# Patient Record
Sex: Male | Born: 1965 | Race: White | Hispanic: No | Marital: Married | Smoking: Never smoker
Health system: Southern US, Community
[De-identification: ages and names within clinical notes are randomized; demographics above are authoritative.]

## PROBLEM LIST (undated history)

## (undated) DIAGNOSIS — M549 Dorsalgia, unspecified: Secondary | ICD-10-CM

## (undated) DIAGNOSIS — M2550 Pain in unspecified joint: Secondary | ICD-10-CM

## (undated) DIAGNOSIS — E663 Overweight: Secondary | ICD-10-CM

## (undated) DIAGNOSIS — E786 Lipoprotein deficiency: Secondary | ICD-10-CM

## (undated) DIAGNOSIS — R0602 Shortness of breath: Secondary | ICD-10-CM

## (undated) DIAGNOSIS — R002 Palpitations: Secondary | ICD-10-CM

## (undated) DIAGNOSIS — R42 Dizziness and giddiness: Secondary | ICD-10-CM

## (undated) DIAGNOSIS — R000 Tachycardia, unspecified: Secondary | ICD-10-CM

## (undated) DIAGNOSIS — M7989 Other specified soft tissue disorders: Secondary | ICD-10-CM

## (undated) DIAGNOSIS — R911 Solitary pulmonary nodule: Secondary | ICD-10-CM

## (undated) DIAGNOSIS — N200 Calculus of kidney: Secondary | ICD-10-CM

## (undated) DIAGNOSIS — R7309 Other abnormal glucose: Secondary | ICD-10-CM

## (undated) DIAGNOSIS — R079 Chest pain, unspecified: Secondary | ICD-10-CM

## (undated) DIAGNOSIS — I10 Essential (primary) hypertension: Secondary | ICD-10-CM

## (undated) DIAGNOSIS — K219 Gastro-esophageal reflux disease without esophagitis: Secondary | ICD-10-CM

## (undated) DIAGNOSIS — I4891 Unspecified atrial fibrillation: Secondary | ICD-10-CM

## (undated) HISTORY — DX: Lipoprotein deficiency: E78.6

## (undated) HISTORY — DX: Essential (primary) hypertension: I10

## (undated) HISTORY — DX: Shortness of breath: R06.02

## (undated) HISTORY — DX: Palpitations: R00.2

## (undated) HISTORY — DX: Calculus of kidney: N20.0

## (undated) HISTORY — DX: Unspecified atrial fibrillation: I48.91

## (undated) HISTORY — DX: Tachycardia, unspecified: R00.0

## (undated) HISTORY — DX: Dizziness and giddiness: R42

## (undated) HISTORY — DX: Other specified soft tissue disorders: M79.89

## (undated) HISTORY — DX: Solitary pulmonary nodule: R91.1

## (undated) HISTORY — DX: Overweight: E66.3

## (undated) HISTORY — DX: Gastro-esophageal reflux disease without esophagitis: K21.9

## (undated) HISTORY — DX: Dorsalgia, unspecified: M54.9

## (undated) HISTORY — DX: Other abnormal glucose: R73.09

## (undated) HISTORY — DX: Chest pain, unspecified: R07.9

## (undated) HISTORY — DX: Pain in unspecified joint: M25.50

---

## 1994-03-12 HISTORY — PX: KNEE SURGERY: SHX244

## 1999-10-15 ENCOUNTER — Ambulatory Visit (HOSPITAL_COMMUNITY): Admission: RE | Admit: 1999-10-15 | Discharge: 1999-10-15 | Payer: Self-pay

## 2009-04-11 DIAGNOSIS — M1990 Unspecified osteoarthritis, unspecified site: Secondary | ICD-10-CM | POA: Insufficient documentation

## 2009-04-11 DIAGNOSIS — Z0000 Encounter for general adult medical examination without abnormal findings: Secondary | ICD-10-CM | POA: Insufficient documentation

## 2011-12-08 ENCOUNTER — Encounter (HOSPITAL_COMMUNITY): Payer: Self-pay | Admitting: Emergency Medicine

## 2011-12-08 ENCOUNTER — Emergency Department (HOSPITAL_COMMUNITY)
Admission: EM | Admit: 2011-12-08 | Discharge: 2011-12-08 | Disposition: A | Discharge status: Home / Self Care | Payer: BC Managed Care – PPO | Attending: Emergency Medicine | Admitting: Emergency Medicine

## 2011-12-08 ENCOUNTER — Other Ambulatory Visit: Payer: Self-pay

## 2011-12-08 ENCOUNTER — Emergency Department (HOSPITAL_COMMUNITY): Payer: BC Managed Care – PPO

## 2011-12-08 DIAGNOSIS — N200 Calculus of kidney: Secondary | ICD-10-CM | POA: Insufficient documentation

## 2011-12-08 DIAGNOSIS — R911 Solitary pulmonary nodule: Secondary | ICD-10-CM | POA: Insufficient documentation

## 2011-12-08 DIAGNOSIS — R109 Unspecified abdominal pain: Secondary | ICD-10-CM

## 2011-12-08 LAB — CBC WITH DIFFERENTIAL/PLATELET
Basophils Absolute: 0 10*3/uL (ref 0.0–0.1)
Basophils Relative: 0 % (ref 0–1)
Eosinophils Absolute: 0 10*3/uL (ref 0.0–0.7)
Eosinophils Relative: 0 % (ref 0–5)
HCT: 44.1 % (ref 39.0–52.0)
Hemoglobin: 15.9 g/dL (ref 13.0–17.0)
Lymphocytes Relative: 15 % (ref 12–46)
Lymphs Abs: 1.8 10*3/uL (ref 0.7–4.0)
MCH: 31.7 pg (ref 26.0–34.0)
MCHC: 36.1 g/dL — ABNORMAL HIGH (ref 30.0–36.0)
MCV: 88 fL (ref 78.0–100.0)
Monocytes Absolute: 0.6 10*3/uL (ref 0.1–1.0)
Monocytes Relative: 5 % (ref 3–12)
Neutro Abs: 9.2 10*3/uL — ABNORMAL HIGH (ref 1.7–7.7)
Neutrophils Relative %: 79 % — ABNORMAL HIGH (ref 43–77)
Platelets: 230 10*3/uL (ref 150–400)
RBC: 5.01 MIL/uL (ref 4.22–5.81)
RDW: 12.6 % (ref 11.5–15.5)
WBC: 11.6 10*3/uL — ABNORMAL HIGH (ref 4.0–10.5)

## 2011-12-08 LAB — URINALYSIS, ROUTINE W REFLEX MICROSCOPIC
Bilirubin Urine: NEGATIVE
Glucose, UA: NEGATIVE mg/dL
Ketones, ur: 15 mg/dL — AB
Leukocytes, UA: NEGATIVE
Nitrite: NEGATIVE
Protein, ur: NEGATIVE mg/dL
Specific Gravity, Urine: 1.018 (ref 1.005–1.030)
Urobilinogen, UA: 0.2 mg/dL (ref 0.0–1.0)
pH: 8 (ref 5.0–8.0)

## 2011-12-08 LAB — BASIC METABOLIC PANEL
BUN: 16 mg/dL (ref 6–23)
CO2: 22 mEq/L (ref 19–32)
Calcium: 10 mg/dL (ref 8.4–10.5)
Chloride: 98 mEq/L (ref 96–112)
Creatinine, Ser: 1.26 mg/dL (ref 0.50–1.35)
GFR calc Af Amer: 79 mL/min — ABNORMAL LOW (ref >90)
GFR calc non Af Amer: 68 mL/min — ABNORMAL LOW (ref >90)
Glucose, Bld: 137 mg/dL — ABNORMAL HIGH (ref 70–99)
Potassium: 3.6 mEq/L (ref 3.5–5.1)
Sodium: 138 mEq/L (ref 135–145)

## 2011-12-08 LAB — POCT I-STAT TROPONIN I: Troponin i, poc: 0.01 ng/mL (ref 0.00–0.08)

## 2011-12-08 LAB — URINE MICROSCOPIC-ADD ON

## 2011-12-08 MED ORDER — TAMSULOSIN HCL 0.4 MG PO CAPS: 0.4000 mg | ORAL_CAPSULE | Freq: Two times a day (BID) | ORAL | Status: DC

## 2011-12-08 MED ORDER — PROMETHAZINE HCL 25 MG PO TABS: 25.0000 mg | ORAL_TABLET | Freq: Four times a day (QID) | ORAL | Status: DC | PRN

## 2011-12-08 MED ORDER — IOHEXOL 300 MG/ML  SOLN
20.0000 mL | INTRAMUSCULAR | Status: AC
  Administered 2011-12-08: 20 mL via ORAL

## 2011-12-08 MED ORDER — MORPHINE SULFATE 4 MG/ML IJ SOLN: 4.0000 mg | Freq: Once | INTRAMUSCULAR | Status: DC

## 2011-12-08 MED ORDER — ONDANSETRON HCL 4 MG/2ML IJ SOLN
4.0000 mg | Freq: Once | INTRAMUSCULAR | Status: AC
  Administered 2011-12-08: 4 mg via INTRAVENOUS
  Filled 2011-12-08: qty 2

## 2011-12-08 MED ORDER — HYDROMORPHONE HCL PF 1 MG/ML IJ SOLN
1.0000 mg | Freq: Once | INTRAMUSCULAR | Status: AC
  Administered 2011-12-08: 1 mg via INTRAVENOUS
  Filled 2011-12-08: qty 1

## 2011-12-08 MED ORDER — MORPHINE SULFATE 4 MG/ML IJ SOLN
4.0000 mg | Freq: Once | INTRAMUSCULAR | Status: AC
  Administered 2011-12-08: 4 mg via INTRAVENOUS
  Filled 2011-12-08: qty 1

## 2011-12-08 MED ORDER — IOHEXOL 300 MG/ML  SOLN
100.0000 mL | Freq: Once | INTRAMUSCULAR | Status: AC | PRN
  Administered 2011-12-08: 100 mL via INTRAVENOUS

## 2011-12-08 MED ORDER — ONDANSETRON HCL 4 MG/2ML IJ SOLN
4.0000 mg | Freq: Once | INTRAMUSCULAR | Status: AC
  Administered 2011-12-08: 4 mg via INTRAVENOUS
  Filled 2011-12-08 (×2): qty 2

## 2011-12-08 MED ORDER — OXYCODONE-ACETAMINOPHEN 5-325 MG PO TABS: 2.0000 | ORAL_TABLET | ORAL | Status: DC | PRN

## 2011-12-08 NOTE — ED Provider Notes (Signed)
Medical screening examination/treatment/procedure(s) were conducted as a shared visit with non-physician practitioner(s) and myself.  I personally evaluated the patient during the encounter  Grayling Schranz, MD 12/08/11 1620 

## 2011-12-08 NOTE — ED Provider Notes (Signed)
11:09 AM Received signout from Dr. Weldon Inches.  Patient is being transferred to the CDU for holding while awaiting CT results.  Dr. Weldon Inches tells me that he believes the patient to be suffering from diverticulitis vs kidney stone, so a CT abdomen with contrast was ordered.    S: Patient tells me that he has been having LLQ abdominal pain since this morning at around 5:00am, this is what brought him to the ED.  He states that his pain is tolerable, but that he would take some pain medicine as long as he doesn't have to get a shot. He is not feeling nauseated at this time.    O: A&Ox4, RRR, no m/r/g, CTAB no w/r/r, abdomen is soft, tender over the left lower quadrant. Mild left-sided CVA tenderness. Moves all 4 extremities with sensation and strength fully intact. No peripheral edema with strong pulses distally. His CBC reveals elevated white blood cell count at 11.6. Urinalysis reveals positive large blood in urine with ketones.  A: Diverticulitis vs kidney stone.    P: Awaiting CT scan with contrast for evaluation of diverticulitis. Given 1 of Dilaudid and 4 of Zofran for pain and nausea.   1:48 PM Patient reports moderate pain. Will give 1 of Dilaudid. CT abdomen results show a 2mm kidney stone in the left distal ureter and 4mm right lung base nodule.  2:12 PM Results for orders placed during the hospital encounter of 12/08/11  CBC WITH DIFFERENTIAL      Component Value Range   WBC 11.6 (*) 4.0 - 10.5 K/uL   RBC 5.01  4.22 - 5.81 MIL/uL   Hemoglobin 15.9  13.0 - 17.0 g/dL   HCT 16.1  09.6 - 04.5 %   MCV 88.0  78.0 - 100.0 fL   MCH 31.7  26.0 - 34.0 pg   MCHC 36.1 (*) 30.0 - 36.0 g/dL   RDW 40.9  81.1 - 91.4 %   Platelets 230  150 - 400 K/uL   Neutrophils Relative 79 (*) 43 - 77 %   Neutro Abs 9.2 (*) 1.7 - 7.7 K/uL   Lymphocytes Relative 15  12 - 46 %   Lymphs Abs 1.8  0.7 - 4.0 K/uL   Monocytes Relative 5  3 - 12 %   Monocytes Absolute 0.6  0.1 - 1.0 K/uL   Eosinophils Relative 0   0 - 5 %   Eosinophils Absolute 0.0  0.0 - 0.7 K/uL   Basophils Relative 0  0 - 1 %   Basophils Absolute 0.0  0.0 - 0.1 K/uL  BASIC METABOLIC PANEL      Component Value Range   Sodium 138  135 - 145 mEq/L   Potassium 3.6  3.5 - 5.1 mEq/L   Chloride 98  96 - 112 mEq/L   CO2 22  19 - 32 mEq/L   Glucose, Bld 137 (*) 70 - 99 mg/dL   BUN 16  6 - 23 mg/dL   Creatinine, Ser 7.82  0.50 - 1.35 mg/dL   Calcium 95.6  8.4 - 21.3 mg/dL   GFR calc non Af Amer 68 (*) >90 mL/min   GFR calc Af Amer 79 (*) >90 mL/min  POCT I-STAT TROPONIN I      Component Value Range   Troponin i, poc 0.01  0.00 - 0.08 ng/mL   Comment 3           URINALYSIS, ROUTINE W REFLEX MICROSCOPIC      Component Value Range  Color, Urine YELLOW  YELLOW   APPearance CLEAR  CLEAR   Specific Gravity, Urine 1.018  1.005 - 1.030   pH 8.0  5.0 - 8.0   Glucose, UA NEGATIVE  NEGATIVE mg/dL   Hgb urine dipstick LARGE (*) NEGATIVE   Bilirubin Urine NEGATIVE  NEGATIVE   Ketones, ur 15 (*) NEGATIVE mg/dL   Protein, ur NEGATIVE  NEGATIVE mg/dL   Urobilinogen, UA 0.2  0.0 - 1.0 mg/dL   Nitrite NEGATIVE  NEGATIVE   Leukocytes, UA NEGATIVE  NEGATIVE  URINE MICROSCOPIC-ADD ON      Component Value Range   RBC / HPF 21-50  <3 RBC/hpf   Ct Abdomen Pelvis W Contrast  12/08/2011  *RADIOLOGY REPORT*  Clinical Data: Chest/lower abdominal region.  Vomiting.  CT ABDOMEN AND PELVIS WITH CONTRAST  Technique:  Multidetector CT imaging of the abdomen and pelvis was performed following the standard protocol during bolus administration of intravenous contrast.  Contrast: OMNIPAQUE IOHEXOL 300 MG/ML  SOLN  Comparison: None.  Findings: 4 mm right lower lobe lung nodule on image 2.  Heart size upper normal, without pericardial or pleural effusion.  Normal liver, spleen, stomach.  Partially fatty replaced pancreatic head.  Normal gallbladder, biliary tract, adrenal glands, right kidney.  There is left perinephric/periureteric edema with slightly  decreased function/excretion from the left kidney. Mild caliectasis.  Left hydroureter which continues to the level of a 2 mm stone at the distal left ureter on image 78.  No retroperitoneal or retrocrural adenopathy.  Normal colon and terminal ileum.  Appendix not visualized. Normal small bowel without abdominal ascites.    No pelvic adenopathy.    Normal urinary bladder and prostate.  No significant free fluid. No acute osseous abnormality.  Age advanced degenerative disc disease at the L4-L5 level; transitional L5 vertebral body.  IMPRESSION:  1.  Punctate distal left ureteric calculus with mild proximal urinary tract obstruction. 2.  4 mm right lung base nodule. If the patient is at high risk for bronchogenic carcinoma, follow-up chest CT at 1 year is recommended.  If the patient is at low risk, no follow-up is needed.   This recommendation follows the consensus statement: "Guidelines for Management of Small Pulmonary Nodules Detected on CT Scans:  A Statement from the Fleischner Society" as published in Radiology 2005; 237:395-400.  Available online at: DietDisorder.cz.   Original Report Authenticated By: Consuello Bossier, M.D.    I have discussed the CT results with this patient. I have advised him to followup with his primary care physician regarding the right lung base nodule in one year for repeat chest x-ray. Will discharge the patient to home with Percocet, Phenergan, and Flomax for the treatment of his kidney stone. Advised the patient to strain his urine, and to followup with a urologist in 2-3 days. The patient is stable and ready for discharge.   Roxy Horseman, PA-C 12/08/11 1425

## 2011-12-08 NOTE — ED Notes (Signed)
Pt in bathroom vomiting at present.

## 2011-12-08 NOTE — ED Notes (Signed)
Family at bedside. 

## 2011-12-08 NOTE — ED Notes (Addendum)
Pt reports woke up at 0500 today with abdominal pain. Pt presents with pain from mid chest to lower abdominal area. Pt vomited in triage area x 1. Pt pacing around reports cannot stay in one place.

## 2011-12-08 NOTE — ED Provider Notes (Signed)
History     CSN: [id number]  Arrival date & time 12/08/11  4098   First MD Initiated Contact with Patient 12/08/11 0913      Chief Complaint  Patient presents with  . Chest Pain  . Abdominal Pain  . Nausea  . Emesis    (Consider location/radiation/quality/duration/timing/severity/associated sxs/prior treatment) Patient is a 46 y.o. male presenting with abdominal pain and vomiting. The history is provided by the patient.  Abdominal Pain The primary symptoms of the illness include abdominal pain, nausea and vomiting. The primary symptoms of the illness do not include fever, shortness of breath, diarrhea or dysuria.  Additional symptoms associated with the illness include diaphoresis. Symptoms associated with the illness do not include chills, hematuria or back pain.  Emesis  Associated symptoms include abdominal pain. Pertinent negatives include no chills, no cough, no diarrhea, no fever and no headaches.   46 year old, male, with no significant past medical history presents to emergency department complaining of left lower abdominal pain.  Since around 5:00.  This morning.  It is associated with nausea and vomiting.  He has not had diarrhea.  He denies blood in his emesis or stool or urine.  The pain has been constant and fluctuates in severity.  It does not move.  Denies chest pain, cough, or shortness of breath.  He has not had fevers, and chills.  He has not been on antibiotics recently.  History reviewed. No pertinent past medical history.  History reviewed. No pertinent past surgical history.  History reviewed. No pertinent family history.  History  Substance Use Topics  . Smoking status: Never Smoker   . Smokeless tobacco: Not on file  . Alcohol Use: No      Review of Systems  Constitutional: Positive for diaphoresis. Negative for fever and chills.  Respiratory: Negative for cough, chest tightness and shortness of breath.   Cardiovascular: Negative for chest pain.      He states he's had intermittent, chest pain, with palpitations.  On other occasions, but he is here for his abdominal pain.  Today  Gastrointestinal: Positive for nausea, vomiting and abdominal pain. Negative for diarrhea and blood in stool.  Genitourinary: Negative for dysuria, hematuria and flank pain.  Musculoskeletal: Negative for back pain.  Neurological: Negative for headaches.  Hematological: Does not bruise/bleed easily.  Psychiatric/Behavioral: Negative for confusion.  All other systems reviewed and are negative.    Allergies  Review of patient's allergies indicates no known allergies.  Home Medications   Current Outpatient Rx  Name Route Sig Dispense Refill  . FAMOTIDINE PO Oral Take 1 tablet by mouth daily as needed. For heartburn    . FISH OIL PO Oral Take 1 tablet by mouth 3 (three) times daily.      BP 161/103  Pulse 88  Resp 20  SpO2 100%  Physical Exam  Nursing note and vitals reviewed. Constitutional: He is oriented to person, place, and time. He appears well-developed and well-nourished. No distress.       Uncomfortable appearing holding his left lower quadrant  HENT:  Head: Normocephalic and atraumatic.  Eyes: Conjunctivae normal and EOM are normal.  Neck: Normal range of motion. Neck supple.  Cardiovascular: Normal rate, regular rhythm and intact distal pulses.   No murmur heard. Pulmonary/Chest: Effort normal and breath sounds normal. No respiratory distress. He has no rales.  Abdominal: Soft. Bowel sounds are normal. He exhibits no distension. There is tenderness. There is no rebound and no guarding.  Tenderness in the left lower quadrant.  Palpation in the right lower quadrant.  Causes pain on the left side of his abdomen  Musculoskeletal: Normal range of motion.  Neurological: He is alert and oriented to person, place, and time.  Skin: Skin is warm and dry.  Psychiatric: He has a normal mood and affect. Thought content normal.    ED Course   Procedures (including critical care time) 46 year old, male, with left lower quadrant pain that does not radiate since a, probably 5 AM today.  It is associated with nausea and vomiting.  He has not had hematuria or dysuria.  I believe he probably has diverticulitis rather than a kidney stone.  We will establish an IV perform laboratory testing, and a CAT scan for evaluation.  Labs Reviewed  CBC WITH DIFFERENTIAL - Abnormal; Notable for the following:    WBC 11.6 (*)     MCHC 36.1 (*)     Neutrophils Relative 79 (*)     Neutro Abs 9.2 (*)     All other components within normal limits  POCT I-STAT TROPONIN I  BASIC METABOLIC PANEL  URINALYSIS, ROUTINE W REFLEX MICROSCOPIC   No results found.   No diagnosis found.    MDM  Left lower abdominal pain        Cheri Guppy, MD 12/08/11 1001

## 2012-04-07 DIAGNOSIS — Z87442 Personal history of urinary calculi: Secondary | ICD-10-CM | POA: Insufficient documentation

## 2015-06-20 ENCOUNTER — Encounter: Payer: Self-pay | Admitting: Cardiology

## 2015-06-20 DIAGNOSIS — I480 Paroxysmal atrial fibrillation: Secondary | ICD-10-CM | POA: Insufficient documentation

## 2015-06-28 DIAGNOSIS — R000 Tachycardia, unspecified: Secondary | ICD-10-CM | POA: Insufficient documentation

## 2015-06-28 DIAGNOSIS — R079 Chest pain, unspecified: Secondary | ICD-10-CM | POA: Insufficient documentation

## 2015-06-28 DIAGNOSIS — R42 Dizziness and giddiness: Secondary | ICD-10-CM | POA: Insufficient documentation

## 2015-06-28 DIAGNOSIS — I4891 Unspecified atrial fibrillation: Secondary | ICD-10-CM | POA: Insufficient documentation

## 2015-06-28 DIAGNOSIS — R002 Palpitations: Secondary | ICD-10-CM | POA: Insufficient documentation

## 2015-06-28 DIAGNOSIS — R0602 Shortness of breath: Secondary | ICD-10-CM | POA: Insufficient documentation

## 2015-06-29 NOTE — Progress Notes (Signed)
Electrophysiology Office Note   Date:  06/30/2015   ID:  Randall Rumpfaul D Pense, DOB 04/15/1965, MRN [id number]  PCP:  Gaspar Garbeichard W Tisovec, MD  Primary Electrophysiologist:  Regan LemmingWill Martin Camnitz, MD    Chief Complaint  Patient presents with  . Advice Only  . Atrial Fibrillation     History of Present Illness: Randall Smith is a 50 y.o. male who presents today for electrophysiology evaluation.   He has a history of palpitations and was diagnosed with atrial fibrillation.  He has occasional dizziness, SOB, and chest pain.  His ECG in his PCP's office shows atrial fibrillation with a rate of 167.  He was put on 25 mg toprol XL and eliquis.  He says that a few years ago, he started having palpitations. He says that they lasted a few hours at a time it occurred mainly when he was at work. He says that he did a weight loss program and lost about 50 pounds and his symptoms went away. He has since regained weight, and his palpitations have returned. He also does have some chest pain but he does not describe it as exertional. He says that he feels like the chest pain is related to his palpitations.   Today, he denies symptoms of palpitations, chest pain, shortness of breath, orthopnea, PND, lower extremity edema, claudication, dizziness, presyncope, syncope, bleeding, or neurologic sequela. The patient is tolerating medications without difficulties and is otherwise without complaint today.    Past Medical History  Diagnosis Date  . Racing heart beat   . Dizzy spells   . Chest pain   . Palpitations   . SOB (shortness of breath)   . GERD (gastroesophageal reflux disease)   . Low HDL (under 40)   . Elevated glucose   . Lung nodule   . Kidney stone   . A-fib (HCC)   . Overweight    Past Surgical History  Procedure Laterality Date  . Knee surgery Left 1996     Current Outpatient Prescriptions  Medication Sig Dispense Refill  . apixaban (ELIQUIS) 5 MG TABS tablet Take 5 mg by mouth 2 (two) times  daily.    Marland Kitchen. FAMOTIDINE PO Take 1 tablet by mouth daily as needed. For heartburn    . metoprolol succinate (TOPROL-XL) 25 MG 24 hr tablet Take 25 mg by mouth daily.     No current facility-administered medications for this visit.    Allergies:   Review of patient's allergies indicates no known allergies.   Social History:  The patient  reports that he has never smoked. He does not have any smokeless tobacco history on file. He reports that he does not drink alcohol or use illicit drugs.   Family History:  The patient's family history includes CAD in his maternal aunt; CVA in his mother; Cancer in his father; Heart attack in his father; Heart attack (age of onset: 2350) in his maternal grandfather; Heart disease in his maternal grandmother and paternal grandmother.    ROS:  Please see the history of present illness.   Otherwise, review of systems is positive for none.   All other systems are reviewed and negative.    PHYSICAL EXAM: VS:  There were no vitals taken for this visit. , BMI There is no height or weight on file to calculate BMI. GEN: Well nourished, well developed, in no acute distress HEENT: normal Neck: no JVD, carotid bruits, or masses Cardiac: RRR; no murmurs, rubs, or gallops,no edema  Respiratory:  clear to auscultation bilaterally, normal work of breathing GI: soft, nontender, nondistended, + BS MS: no deformity or atrophy Skin: warm and dry Neuro:  Strength and sensation are intact Psych: euthymic mood, full affect  EKG:  EKG is ordered today. The ekg ordered today shows sinus rhythm, rate 70, RAA  Recent Labs: No results found for requested labs within last 365 days.    Lipid Panel  No results found for: CHOL, TRIG, HDL, CHOLHDL, VLDL, LDLCALC, LDLDIRECT   Wt Readings from Last 3 Encounters:  No data found for Wt      Other studies Reviewed: Additional studies/ records that were reviewed today include: PCP notes  ASSESSMENT AND PLAN:  1.  Paroxysmal  atrial fibrillation: currently on Toprol XL and Eliquis.  He has no risk factors for atrial fibrillation.  Due to that, will plan for a TTE to better determine his LA size and evaluate for any structural heart disease.  He recently had a TSH drawn by his primary physician, and will work to get the results of that test. He has a CHADS2VASc of 0, and thus does not require long term anticoagulation for stroke prevention.  He is in sinus rhythm today. I have discussed with him the options of rhythm control, but since he is had minimal atrial fibrillation, it seems that the Toprol-XL would be sufficient at this time. He does snore, and he is unsure if he stops breathing when he sleeps. Will get a sleep study to see if he has sleep apnea.  This patients CHA2DS2-VASc Score and unadjusted Ischemic Stroke Rate (% per year) is equal to 0.2 % stroke rate/year from a score of 0  Above score calculated as 1 point each if present [CHF, HTN, DM, Vascular=MI/PAD/Aortic Plaque, Age if 65-74, or Male] Above score calculated as 2 points each if present [Age > 75, or Stroke/TIA/TE]       Current medicines are reviewed at length with the patient today.   The patient does not have concerns regarding his medicines.  The following changes were made today:  Hold eliquis  Labs/ tests ordered today include:  No orders of the defined types were placed in this encounter.     Disposition:   FU with Will Camnitz 3 months  Signed, Will Jorja Loa, MD  06/30/2015 9:04 AM     [facility] [address] [place] [address] [phone] (office) [phone] (fax)

## 2015-06-30 ENCOUNTER — Encounter: Payer: Self-pay | Admitting: Cardiology

## 2015-06-30 ENCOUNTER — Ambulatory Visit (INDEPENDENT_AMBULATORY_CARE_PROVIDER_SITE_OTHER): Payer: BLUE CROSS/BLUE SHIELD | Admitting: Cardiology

## 2015-06-30 VITALS — BP 130/98 | HR 70 | Ht 73.0 in | Wt 231.0 lb

## 2015-06-30 DIAGNOSIS — R0683 Snoring: Secondary | ICD-10-CM | POA: Diagnosis not present

## 2015-06-30 DIAGNOSIS — I480 Paroxysmal atrial fibrillation: Secondary | ICD-10-CM

## 2015-06-30 NOTE — Patient Instructions (Addendum)
Medication Instructions:  Your physician recommends that you continue on your current medications as directed. Please refer to the Current Medication list given to you today.  Labwork: None ordered  - we will obtain TSH lab result from your primary doctor  Testing/Procedures: Your physician has requested that you have an echocardiogram. Echocardiography is a painless test that uses sound waves to create images of your heart. It provides your doctor with information about the size and shape of your heart and how well your heart's chambers and valves are working. This procedure takes approximately one hour. There are no restrictions for this procedure.  Your physician has recommended that you have a sleep study. This test records several body functions during sleep, including: brain activity, eye movement, oxygen and carbon dioxide blood levels, heart rate and rhythm, breathing rate and rhythm, the flow of air through your mouth and nose, snoring, body muscle movements, and chest and belly movement.  Follow-Up: Your physician recommends that you schedule a follow-up appointment in: 3 months with Dr. Elberta Fortisamnitz.   If you need a refill on your cardiac medications before your next appointment, please call your pharmacy.  Thank you for choosing [facility]!!   Dory HornSherri Price, RN [phone]

## 2015-07-14 ENCOUNTER — Other Ambulatory Visit: Payer: Self-pay

## 2015-07-14 ENCOUNTER — Ambulatory Visit (HOSPITAL_COMMUNITY): Payer: BLUE CROSS/BLUE SHIELD | Attending: Cardiology

## 2015-07-14 DIAGNOSIS — I4891 Unspecified atrial fibrillation: Secondary | ICD-10-CM | POA: Diagnosis present

## 2015-07-14 DIAGNOSIS — R079 Chest pain, unspecified: Secondary | ICD-10-CM | POA: Insufficient documentation

## 2015-07-14 DIAGNOSIS — I517 Cardiomegaly: Secondary | ICD-10-CM | POA: Diagnosis not present

## 2015-07-14 DIAGNOSIS — I480 Paroxysmal atrial fibrillation: Secondary | ICD-10-CM | POA: Diagnosis not present

## 2015-07-14 DIAGNOSIS — R002 Palpitations: Secondary | ICD-10-CM | POA: Diagnosis not present

## 2015-08-28 ENCOUNTER — Ambulatory Visit (HOSPITAL_BASED_OUTPATIENT_CLINIC_OR_DEPARTMENT_OTHER): Payer: BLUE CROSS/BLUE SHIELD | Attending: Cardiology | Admitting: Cardiology

## 2015-08-28 VITALS — Ht 73.0 in | Wt 232.0 lb

## 2015-08-28 DIAGNOSIS — G4733 Obstructive sleep apnea (adult) (pediatric): Secondary | ICD-10-CM | POA: Diagnosis not present

## 2015-08-28 DIAGNOSIS — I491 Atrial premature depolarization: Secondary | ICD-10-CM | POA: Diagnosis not present

## 2015-08-28 DIAGNOSIS — I480 Paroxysmal atrial fibrillation: Secondary | ICD-10-CM | POA: Insufficient documentation

## 2015-08-28 DIAGNOSIS — R0683 Snoring: Secondary | ICD-10-CM | POA: Insufficient documentation

## 2015-09-27 NOTE — Progress Notes (Signed)
Electrophysiology Office Note   Date:  09/28/2015   [name], DOB [birth year], MRN [id number]  PCP:  Randall Garbe, MD  Primary Electrophysiologist:  Randall Lemming, MD    Chief Complaint  Patient presents with  . Follow-up    3 months  . PAF     History of Present Illness: Randall Smith is a 50 y.o. male who presents today for electrophysiology evaluation.   He has a history of palpitations and was diagnosed with atrial fibrillation.  He has occasional dizziness, SOB, and chest pain.  His ECG in his PCP's office shows atrial fibrillation with a rate of 167.  He was put on 25 mg toprol XL and eliquis.  He says that a few years ago, he started having palpitations. He is continuing to have palpitations on the Toprol-XL. He says that they last for 30 minutes at a time and happen a couple times a week.  Today, he denies symptoms of chest pain, shortness of breath, orthopnea, PND, lower extremity edema, claudication, dizziness, presyncope, syncope, bleeding, or neurologic sequela. The patient is tolerating medications without difficulties and is otherwise without complaint today.    Past Medical History  Diagnosis Date  . Racing heart beat   . Dizzy spells   . Chest pain   . Palpitations   . SOB (shortness of breath)   . GERD (gastroesophageal reflux disease)   . Low HDL (under 40)   . Elevated glucose   . Lung nodule   . Kidney stone   . A-fib (HCC)   . Overweight    Past Surgical History  Procedure Laterality Date  . Knee surgery Left 1996     Current Outpatient Prescriptions  Medication Sig Dispense Refill  . metoprolol succinate (TOPROL-XL) 25 MG 24 hr tablet Take 25 mg by mouth daily.     No current facility-administered medications for this visit.    Allergies:   Review of patient's allergies indicates no known allergies.   Social History:  The patient  reports that he has never smoked. He does not have any smokeless tobacco history on file. He  reports that he does not drink alcohol or use illicit drugs.   Family History:  The patient's family history includes CAD in his maternal aunt; CVA in his mother; Cancer in his father; Heart attack in his father; Heart attack (age of onset: 16) in his maternal grandfather; Heart disease in his maternal grandmother and paternal grandmother.    ROS:  Please see the history of present illness.   Otherwise, review of systems is positive for palpitations.   All other systems are reviewed and negative.    PHYSICAL EXAM: VS:  BP 136/92 mmHg  Pulse 70  Ht  (1.854 m)  Wt 231 lb 6.4 oz (104.962 kg)  BMI 30.54 kg/m2 , BMI Body mass index is 30.54 kg/(m^2). GEN: Well nourished, well developed, in no acute distress HEENT: normal Neck: no JVD, carotid bruits, or masses Cardiac: RRR; no murmurs, rubs, or gallops,no edema  Respiratory:  clear to auscultation bilaterally, normal work of breathing GI: soft, nontender, nondistended, + BS MS: no deformity or atrophy Skin: warm and dry Neuro:  Strength and sensation are intact Psych: euthymic mood, full affect  EKG:  EKG is not ordered today.   Recent Labs: No results found for requested labs within last 365 days.    Lipid Panel  No results found for: CHOL, TRIG, HDL, CHOLHDL, VLDL, LDLCALC,  LDLDIRECT   Wt Readings from Last 3 Encounters:  09/28/15 231 lb 6.4 oz (104.962 kg)  08/28/15 232 lb (105.235 kg)  06/30/15 231 lb (104.781 kg)      Other studies Reviewed: Additional studies/ records that were reviewed today include: TTE 07/14/15 - Left ventricle: The cavity size was normal. Wall thickness was  normal. Systolic function was normal. The estimated ejection  fraction was in the range of 60% to 65%. Wall motion was normal;  there were no regional wall motion abnormalities. Left  ventricular diastolic function parameters were normal. - Left atrium: The atrium was mildly dilated.  ASSESSMENT AND PLAN:  1.  Paroxysmal atrial  fibrillation: currently on Toprol XL and Eliquis.  He has no risk factors for atrial fibrillation.  Due to his current symptoms from atrial fibrillation, we Randall Smith start him on flecainide 75 mg twice daily. We'll get an exercise treadmill test in 1 week.  This patients CHA2DS2-VASc Score and unadjusted Ischemic Stroke Rate (% per year) is equal to 0.2 % stroke rate/year from a score of 0  Above score calculated as 1 point each if present [CHF, HTN, DM, Vascular=MI/PAD/Aortic Plaque, Age if 65-74, or Male] Above score calculated as 2 points each if present [Age > 75, or Stroke/TIA/TE]  Current medicines are reviewed at length with the patient today.   The patient does not have concerns regarding his medicines.  The following changes were made today:  Start flecainide 75 BID  Labs/ tests ordered today include:  No orders of the defined types were placed in this encounter.     Disposition:   FU with Randall Smith 6 months  Signed, Randall Smith Randall LoaMartin Scott Vanderveer, MD  09/28/2015 10:20 AM     [facility] [address] [place] [address] [phone] (office) [phone] (fax)

## 2015-09-28 ENCOUNTER — Encounter (INDEPENDENT_AMBULATORY_CARE_PROVIDER_SITE_OTHER): Payer: Self-pay

## 2015-09-28 ENCOUNTER — Encounter: Payer: Self-pay | Admitting: Cardiology

## 2015-09-28 ENCOUNTER — Ambulatory Visit (INDEPENDENT_AMBULATORY_CARE_PROVIDER_SITE_OTHER): Payer: BLUE CROSS/BLUE SHIELD | Admitting: Cardiology

## 2015-09-28 VITALS — BP 136/92 | HR 70 | Ht 73.0 in | Wt 231.4 lb

## 2015-09-28 DIAGNOSIS — I480 Paroxysmal atrial fibrillation: Secondary | ICD-10-CM | POA: Diagnosis not present

## 2015-09-28 MED ORDER — FLECAINIDE ACETATE 50 MG PO TABS: 75.0000 mg | ORAL_TABLET | Freq: Two times a day (BID) | ORAL | Status: DC

## 2015-09-28 NOTE — Patient Instructions (Addendum)
Medication Instructions:  Your physician has recommended you make the following change in your medication:   1) Start Flecainide 75 mg twice daily   Labwork: None ordered   Testing/Procedures: Your physician has requested that you have an exercise tolerance test. For further information please visit [email or web address]. Please also follow instruction sheet, as given.  Dr Norris Crossurner's nurse will call you in regards to sleep study results within the week    Follow-Up: Your physician wants you to follow-up in: 6 months with Dr Perrin Smackamnitz You will receive a reminder letter in the mail two months in advance. If you don't receive a letter, please call our office to schedule the follow-up appointment.   Thank you for choosing [facility]!!

## 2015-09-30 ENCOUNTER — Telehealth: Payer: Self-pay | Admitting: Cardiology

## 2015-09-30 NOTE — Procedures (Signed)
   Patient Name: Randall Smith, Randall Smith MRN: [id number] Study Date: 08/28/2015 Gender: Male D.O.B: 16-May-1965 Age (years): 50 Referring Provider: Loman BrooklynWill Camnitz Interpreting Physician: Armanda Magicraci Romon Devereux MD, ABSM RPSGT: Melburn PopperWillard, Susan Weight (lbs): 232 Height (inches): 73 BMI: 31 Neck Size: 17.00  CLINICAL INFORMATION Sleep Study Type: NPSG Indication for sleep study: Snoring Epworth Sleepiness Score: 9  SLEEP STUDY TECHNIQUE  As per the AASM Manual for the Scoring of Sleep and Associated Events v2.3 (April 2016) with a hypopnea requiring 4% desaturations. The channels recorded and monitored were frontal, central and occipital EEG, electrooculogram (EOG), submentalis EMG (chin), nasal and oral airflow, thoracic and abdominal wall motion, anterior tibialis EMG, snore microphone, electrocardiogram, and pulse oximetry.  MEDICATIONS Patient's medications include: Reviewed in the chart. Medications self-administered by patient during sleep study : No sleep medicine administered.  SLEEP ARCHITECTURE The study was initiated at 10:04:29 PM and ended at 4:49:36 AM. Sleep onset time was prolonged at 53.3 minutes and the sleep efficiency was reduced at 77.3%. The total sleep time was 313.3 minutes. Stage REM latency was prolonged at 208.0 minutes. The patient spent 8.30% of the night in stage N1 sleep, 73.03% in stage N2 sleep, 0.96% in stage N3 and 17.72% in REM. Alpha intrusion was absent. Supine sleep was 24.42%.  RESPIRATORY PARAMETERS The overall apnea/hypopnea index (AHI) was 7.1 per hour. There were 0 total apneas, including 0 obstructive, 0 central and 0 mixed apneas. There were 37 hypopneas and 12 RERAs. The AHI during Stage REM sleep was 17.3 per hour. AHI while supine was 23.5 per hour. The mean oxygen saturation was 94.65%. The minimum SpO2 during sleep was 87.00%. Loud snoring was noted during this study.  CARDIAC DATA The 2 lead EKG demonstrated sinus rhythm. The mean heart rate was  61.40 beats per minute. Other EKG findings include: Occasional PACs..  LEG MOVEMENT DATA The total PLMS were 13 with a resulting PLMS index of 2.49. Associated arousal with leg movement index was 0.2 .  IMPRESSIONS - Mild obstructive sleep apnea occurred during this study (AHI = 7.1/h). - No significant central sleep apnea occurred during this study (CAI = 0.0/h). - Mild oxygen desaturation was noted during this study (Min O2 = 87.00%). - The patient snored with Loud snoring volume. - Occasional PACs were noted during this study. - Clinically significant periodic limb movements did not occur during sleep. No significant associated arousals.  DIAGNOSIS - Obstructive Sleep Apnea (327.23 [G47.33 ICD-10])  RECOMMENDATIONS - Positional therapy avoiding supine position during sleep. - Very mild obstructive sleep apnea. Return to discuss treatment options. - Avoid alcohol, sedatives and other CNS depressants that may worsen sleep apnea and disrupt normal sleep architecture. - Sleep hygiene should be reviewed to assess factors that may improve sleep quality. - Weight management and regular exercise should be initiated or continued if appropriate.    Armanda Magicraci Dwayn Moravek Diplomate, American Board of Sleep Medicine  ELECTRONICALLY SIGNED ON:  09/30/2015, 10:05 PM  [facility] PH: [phone]   FX: [phone] ACCREDITED BY THE AMERICAN ACADEMY OF SLEEP MEDICINE

## 2015-09-30 NOTE — Telephone Encounter (Signed)
Please let patient know that they have mild sleep apnea.   Please set up OV at next available to discuss treatment options. 

## 2015-10-03 NOTE — Telephone Encounter (Signed)
Patient states that at this time he does not want to proceed with making an appointment to discuss.  I let him know that we would be here to discuss if he changed his mind.  He appreciated the call and said that if anything changed he would call me back. Message routed to Dr. Mayford Knife & Dr. Elberta Fortis just as Lorain Childes

## 2015-10-12 ENCOUNTER — Ambulatory Visit (INDEPENDENT_AMBULATORY_CARE_PROVIDER_SITE_OTHER): Payer: BLUE CROSS/BLUE SHIELD

## 2015-10-12 DIAGNOSIS — I480 Paroxysmal atrial fibrillation: Secondary | ICD-10-CM | POA: Diagnosis not present

## 2015-10-12 LAB — EXERCISE TOLERANCE TEST
Estimated workload: 12 METS
Exercise duration (min): 10 min
Exercise duration (sec): 11 s
MPHR: 172 {beats}/min
Peak HR: 148 {beats}/min
Percent HR: 86 %
RPE: 18
Rest HR: 67 {beats}/min

## 2015-10-14 ENCOUNTER — Telehealth: Payer: Self-pay | Admitting: *Deleted

## 2015-10-14 DIAGNOSIS — R9439 Abnormal result of other cardiovascular function study: Secondary | ICD-10-CM

## 2015-10-14 NOTE — Telephone Encounter (Signed)
Baird Lyons, RN  Will Jorja Loa, MD Cc: Baird Lyons, RN      Previous Messages    ----- Message -----  From: Delynn Flavin  Sent: 10/12/2015  3:54 PM  To: Baird Lyons, RN  Subject: abn GXT                      Just a friendly FYI. This patient was done for flecainide and it looks positive for possible ischemia.  Orpha Bur

## 2015-10-14 NOTE — Telephone Encounter (Signed)
Per Dr. Elberta Fortis - patient is to have a follow up lexi due to abnormal GXT. Advised patient of testing need and informed that office would call him to arrange. Test instructions reviewed with the patient. Patient agreeable to plan.

## 2015-10-17 ENCOUNTER — Telehealth (HOSPITAL_COMMUNITY): Payer: Self-pay | Admitting: Radiology

## 2015-10-17 NOTE — Telephone Encounter (Signed)
Patient given detailed instructions per Myocardial Perfusion Study Information Sheet for the test on 10/20/2015 at 7:30. Patient notified to arrive 15 minutes early and that it is imperative to arrive on time for appointment to keep from having the test rescheduled.  If you need to cancel or reschedule your appointment, please call the office within 24 hours of your appointment. Failure to do so may result in a cancellation of your appointment, and a $50 no show fee. Patient verbalized understanding.EHK

## 2015-10-18 ENCOUNTER — Telehealth (HOSPITAL_COMMUNITY): Payer: Self-pay | Admitting: *Deleted

## 2015-10-18 NOTE — Telephone Encounter (Signed)
Patient given detailed instructions per Myocardial Perfusion Study Information Sheet for the test on 10/20/15 at 0730. Patient notified to arrive 15 minutes early and that it is imperative to arrive on time for appointment to keep from having the test rescheduled.  If you need to cancel or reschedule your appointment, please call the office within 24 hours of your appointment. Failure to do so may result in a cancellation of your appointment, and a $50 no show fee. Patient verbalized understanding.Hayes Czaja, Adelene IdlerCynthia W

## 2015-10-20 ENCOUNTER — Ambulatory Visit (HOSPITAL_COMMUNITY): Payer: BLUE CROSS/BLUE SHIELD | Attending: Cardiovascular Disease

## 2015-10-20 ENCOUNTER — Encounter (INDEPENDENT_AMBULATORY_CARE_PROVIDER_SITE_OTHER): Payer: Self-pay

## 2015-10-20 DIAGNOSIS — R9439 Abnormal result of other cardiovascular function study: Secondary | ICD-10-CM | POA: Diagnosis not present

## 2015-10-20 DIAGNOSIS — Z8249 Family history of ischemic heart disease and other diseases of the circulatory system: Secondary | ICD-10-CM | POA: Diagnosis not present

## 2015-10-20 DIAGNOSIS — I4891 Unspecified atrial fibrillation: Secondary | ICD-10-CM | POA: Insufficient documentation

## 2015-10-20 DIAGNOSIS — R002 Palpitations: Secondary | ICD-10-CM | POA: Insufficient documentation

## 2015-10-20 DIAGNOSIS — R0602 Shortness of breath: Secondary | ICD-10-CM | POA: Insufficient documentation

## 2015-10-20 LAB — MYOCARDIAL PERFUSION IMAGING
LV dias vol: 129 mL (ref 62–150)
LV sys vol: 50 mL
Peak HR: 87 {beats}/min
RATE: 0.29
Rest HR: 53 {beats}/min
SDS: 0
SRS: 0
SSS: 0
TID: 0.99

## 2015-10-20 MED ORDER — TECHNETIUM TC 99M TETROFOSMIN IV KIT
10.0000 | PACK | Freq: Once | INTRAVENOUS | Status: AC | PRN
  Administered 2015-10-20: 10 via INTRAVENOUS
  Filled 2015-10-20: qty 10

## 2015-10-20 MED ORDER — TECHNETIUM TC 99M TETROFOSMIN IV KIT
30.0000 | PACK | Freq: Once | INTRAVENOUS | Status: AC | PRN
  Administered 2015-10-20: 30 via INTRAVENOUS
  Filled 2015-10-20: qty 30

## 2015-10-20 MED ORDER — REGADENOSON 0.4 MG/5ML IV SOLN
0.4000 mg | Freq: Once | INTRAVENOUS | Status: AC
  Administered 2015-10-20: 0.4 mg via INTRAVENOUS

## 2016-10-15 ENCOUNTER — Other Ambulatory Visit: Payer: Self-pay | Admitting: Cardiology

## 2016-11-16 ENCOUNTER — Telehealth: Payer: Self-pay | Admitting: Cardiology

## 2016-11-16 ENCOUNTER — Other Ambulatory Visit: Payer: Self-pay | Admitting: *Deleted

## 2016-11-16 MED ORDER — FLECAINIDE ACETATE 50 MG PO TABS: ORAL_TABLET | ORAL | 0 refills | Status: DC

## 2016-11-16 NOTE — Telephone Encounter (Signed)
Rx sent with a note that patient must keep appointment for further refills.

## 2016-11-16 NOTE — Telephone Encounter (Signed)
New message     *STAT* If patient is at the pharmacy, call can be transferred to refill team.   1. Which medications need to be refilled? (please list name of each medication and dose if known) flecainide 50 mg  2. Which pharmacy/location (including street and city if local pharmacy) is medication to be sent to? [facility]   3. Do they need a 30 day or 90 day supply? 30 day

## 2016-11-28 ENCOUNTER — Encounter: Payer: Self-pay | Admitting: Cardiology

## 2016-11-28 ENCOUNTER — Ambulatory Visit (INDEPENDENT_AMBULATORY_CARE_PROVIDER_SITE_OTHER): Payer: BLUE CROSS/BLUE SHIELD | Admitting: Cardiology

## 2016-11-28 VITALS — BP 138/98 | HR 54 | Ht 73.0 in | Wt 236.0 lb

## 2016-11-28 DIAGNOSIS — I480 Paroxysmal atrial fibrillation: Secondary | ICD-10-CM

## 2016-11-28 NOTE — Progress Notes (Signed)
Electrophysiology Office Note   Date:  11/28/2016   ID:  BARON PARMELEE, DOB [birth year], MRN [id number]  PCP:  Gaspar Garbe, MD  Primary Electrophysiologist:  Regan Lemming, MD    Chief Complaint  Patient presents with  . Follow-up    PAF     History of Present Illness: EARLAND REISH is a 51 y.o. male who presents today for electrophysiology evaluation.   He has a history of palpitations and was diagnosed with atrial fibrillation.  He has occasional dizziness, SOB, and chest pain.  His ECG in his PCP's office shows atrial fibrillation with a rate of 167.  He was put on 25 mg toprol XL and eliquis.  He says that a few years ago, he started having palpitations. He continues to have palpitations on Toprol-XL and was started on flecainide at his last visit. He returns today for atrial fibrillation follow-up.  Today, denies symptoms of palpitations, chest pain, shortness of breath, orthopnea, PND, lower extremity edema, claudication, dizziness, presyncope, syncope, bleeding, or neurologic sequela. The patient is tolerating medications without difficulties. He is having a few episodes of palpitations once or twice a month. This is greatly improved from his palpitations prior to initiation of flecainide. He was having trouble cutting the flecainide in half and thus is only taking 50 mg and said of 75.   Past Medical History:  Diagnosis Date  . A-fib (HCC)   . Chest pain   . Dizzy spells   . Elevated glucose   . GERD (gastroesophageal reflux disease)   . Kidney stone   . Low HDL (under 40)   . Lung nodule   . Overweight   . Palpitations   . Racing heart beat   . SOB (shortness of breath)    Past Surgical History:  Procedure Laterality Date  . KNEE SURGERY Left 1996     Current Outpatient Prescriptions  Medication Sig Dispense Refill  . flecainide (TAMBOCOR) 50 MG tablet TAKE 1& 1/2 TABLETS BY MOUTH 2 TIMES DAILY. Patient must keep upcoming appointment for further refills  90 tablet 0  . metoprolol succinate (TOPROL-XL) 25 MG 24 hr tablet Take 25 mg by mouth daily.     No current facility-administered medications for this visit.     Allergies:   Patient has no known allergies.   Social History:  The patient  reports that he has never smoked. He has never used smokeless tobacco. He reports that he does not drink alcohol or use drugs.   Family History:  The patient's family history includes CAD in his maternal aunt; CVA in his mother; Cancer in his father; Heart attack in his father; Heart attack (age of onset: 19) in his maternal grandfather; Heart disease in his maternal grandmother and paternal grandmother.    ROS:  Please see the history of present illness.   Otherwise, review of systems is positive for none.   All other systems are reviewed and negative.   PHYSICAL EXAM: VS:  BP (!) 138/98   Pulse (!) 54   Ht  (1.854 m)   Wt 236 lb (107 kg)   SpO2 96%   BMI 31.14 kg/m  , BMI Body mass index is 31.14 kg/m. GEN: Well nourished, well developed, in no acute distress  HEENT: normal  Neck: no JVD, carotid bruits, or masses Cardiac: RRR; no murmurs, rubs, or gallops,no edema  Respiratory:  clear to auscultation bilaterally, normal work of breathing GI: soft, nontender, nondistended, + BS  MS: no deformity or atrophy  Skin: warm and dry Neuro:  Strength and sensation are intact Psych: euthymic mood, full affect  EKG:  EKG is ordered today. Personal review of the ekg ordered shows SR, rate 54   Recent Labs: No results found for requested labs within last 8760 hours.    Lipid Panel  No results found for: CHOL, TRIG, HDL, CHOLHDL, VLDL, LDLCALC, LDLDIRECT   Wt Readings from Last 3 Encounters:  11/28/16 236 lb (107 kg)  09/28/15 231 lb 6.4 oz (105 kg)  08/28/15 232 lb (105.2 kg)      Other studies Reviewed: Additional studies/ records that were reviewed today include: TTE 07/14/15 - Left ventricle: The cavity size was normal. Wall  thickness was  normal. Systolic function was normal. The estimated ejection  fraction was in the range of 60% to 65%. Wall motion was normal;  there were no regional wall motion abnormalities. Left  ventricular diastolic function parameters were normal. - Left atrium: The atrium was mildly dilated.  ASSESSMENT AND PLAN:  1.  Paroxysmal atrial fibrillation: On Toprol-XL, Eliquis, and flecainide which was started last visit. Tolerating well without issue. His exit taking 50 mg instead of 75's he was having difficulty cutting the tabs. No changes at this time.  This patients CHA2DS2-VASc Score and unadjusted Ischemic Stroke Rate (% per year) is equal to 0.2 % stroke rate/year from a score of 0  Above score calculated as 1 point each if present [CHF, HTN, DM, Vascular=MI/PAD/Aortic Plaque, Age if 65-74, or Male] Above score calculated as 2 points each if present [Age > 75, or Stroke/TIA/TE]  Current medicines are reviewed at length with the patient today.   The patient does not have concerns regarding his medicines.  The following changes were made today:  None  Labs/ tests ordered today include:  No orders of the defined types were placed in this encounter.    Disposition:   FU with Narvel Kozub 6 months  Signed, Kasen Sako Jorja Loa, MD  11/28/2016 11:15 AM     [facility] [address] [place] [address] [phone] (office) [phone] (fax)

## 2016-11-28 NOTE — Patient Instructions (Signed)
Medication Instructions:  Your physician recommends that you continue on your current medications as directed. Please refer to the Current Medication list given to you today.  If you need a refill on your cardiac medications before your next appointment, please call your pharmacy.   Labwork: None ordered  Testing/Procedures: None ordered  Follow-Up: Your physician wants you to follow-up in: 6 months with Dr. Camnitz.  You will receive a reminder letter in the mail two months in advance. If you don't receive a letter, please call our office to schedule the follow-up appointment.  Thank you for choosing [facility]!!   Vera Wishart, RN [phone]         

## 2017-05-14 ENCOUNTER — Other Ambulatory Visit: Payer: Self-pay | Admitting: Cardiology

## 2017-05-28 ENCOUNTER — Encounter: Payer: Self-pay | Admitting: Internal Medicine

## 2017-06-07 ENCOUNTER — Ambulatory Visit: Payer: BLUE CROSS/BLUE SHIELD | Admitting: Cardiology

## 2017-06-07 ENCOUNTER — Encounter: Payer: Self-pay | Admitting: Cardiology

## 2017-06-07 ENCOUNTER — Encounter (INDEPENDENT_AMBULATORY_CARE_PROVIDER_SITE_OTHER): Payer: Self-pay

## 2017-06-07 VITALS — BP 142/100 | HR 68 | Ht 73.0 in | Wt 238.0 lb

## 2017-06-07 DIAGNOSIS — I480 Paroxysmal atrial fibrillation: Secondary | ICD-10-CM

## 2017-06-07 MED ORDER — FLECAINIDE ACETATE 100 MG PO TABS: 100.0000 mg | ORAL_TABLET | Freq: Two times a day (BID) | ORAL | 3 refills | Status: DC

## 2017-06-07 NOTE — Patient Instructions (Addendum)
Medication Instructions:  Your physician has recommended you make the following change in your medication:  1. INCREASE Flecainide to 100 mg twice daily  Labwork: None ordered  Testing/Procedures: None ordered  Follow-Up: Your physician recommends that you schedule a follow-up appointment in: 1-2 weeks for nurse visit EKG & BP check.  Your physician wants you to follow-up in: 6 months with Dr. Elberta Fortisamnitz.  You will receive a reminder letter in the mail two months in advance. If you don't receive a letter, please call our office to schedule the follow-up appointment.  * If you need a refill on your cardiac medications before your next appointment, please call your pharmacy.   *Please note that any paperwork needing to be filled out by the provider will need to be addressed at the front desk prior to seeing the provider. Please note that any FMLA, disability or other documents regarding health condition is subject to a $25.00 charge that must be received prior to completion of paperwork in the form of a money order or check.  Thank you for choosing [facility]!!   Dory HornSherri Price, RN [phone]  Any Other Special Instructions Will Be Listed Below (If Applicable).

## 2017-06-07 NOTE — Progress Notes (Signed)
Electrophysiology Office Note   Date:  06/07/2017   ID:  KADON ANDRUS, DOB 03/21/1965, MRN [id number]  PCP:  Gaspar Garbe, MD  Primary Electrophysiologist:  Regan Lemming, MD    Chief Complaint  Patient presents with  . Follow-up    PAF     History of Present Illness: Randall Smith is a 52 y.o. male who presents today for electrophysiology evaluation.   He has a history of palpitations and was diagnosed with atrial fibrillation.  He has occasional dizziness, SOB, and chest pain.  His ECG in his PCP's office shows atrial fibrillation with a rate of 167.  He was put on 25 mg toprol XL and eliquis.  He says that a few years ago, he started having palpitations. He continues to have palpitations on Toprol-XL and was started on flecainide at his last visit. He returns today for atrial fibrillation follow-up.  Today, denies symptoms of palpitations, chest pain, shortness of breath, orthopnea, PND, lower extremity edema, claudication, dizziness, presyncope, syncope, bleeding, or neurologic sequela. The patient is tolerating medications without difficulties.  He is doing well.  He does notice up to 3 times a week palpitations that last between 5 and 10 minutes.  He is felt well in between that.  He is apparently under quite a bit of stress.  Past Medical History:  Diagnosis Date  . A-fib (HCC)   . Chest pain   . Dizzy spells   . Elevated glucose   . GERD (gastroesophageal reflux disease)   . Kidney stone   . Low HDL (under 40)   . Lung nodule   . Overweight   . Palpitations   . Racing heart beat   . SOB (shortness of breath)    Past Surgical History:  Procedure Laterality Date  . KNEE SURGERY Left 1996     Current Outpatient Medications  Medication Sig Dispense Refill  . metoprolol succinate (TOPROL-XL) 25 MG 24 hr tablet Take 25 mg by mouth daily.    . flecainide (TAMBOCOR) 100 MG tablet Take 1 tablet (100 mg total) by mouth 2 (two) times daily. 180 tablet 3   No  current facility-administered medications for this visit.     Allergies:   Patient has no known allergies.   Social History:  The patient  reports that he has never smoked. He has never used smokeless tobacco. He reports that he does not drink alcohol or use drugs.   Family History:  The patient's family history includes CAD in his maternal aunt; CVA in his mother; Cancer in his father; Heart attack in his father; Heart attack (age of onset: 72) in his maternal grandfather; Heart disease in his maternal grandmother and paternal grandmother.    ROS:  Please see the history of present illness.   Otherwise, review of systems is positive for none.   All other systems are reviewed and negative.   PHYSICAL EXAM: VS:  BP (!) 142/100   Pulse 68   Ht 6\' 1"  (1.854 m)   Wt 238 lb (108 kg)   BMI 31.40 kg/m  , BMI Body mass index is 31.4 kg/m. GEN: Well nourished, well developed, in no acute distress  HEENT: normal  Neck: no JVD, carotid bruits, or masses Cardiac: RRR; no murmurs, rubs, or gallops,no edema  Respiratory:  clear to auscultation bilaterally, normal work of breathing GI: soft, nontender, nondistended, + BS MS: no deformity or atrophy  Skin: warm and dry Neuro:  Strength and sensation  are intact Psych: euthymic mood, full affect  EKG:  EKG is ordered today. Personal review of the ekg ordered shows anus rhythm, rate 68   Recent Labs: No results found for requested labs within last 8760 hours.    Lipid Panel  No results found for: CHOL, TRIG, HDL, CHOLHDL, VLDL, LDLCALC, LDLDIRECT   Wt Readings from Last 3 Encounters:  06/07/17 238 lb (108 kg)  11/28/16 236 lb (107 kg)  09/28/15 231 lb 6.4 oz (105 kg)      Other studies Reviewed: Additional studies/ records that were reviewed today include: TTE 07/14/15 - Left ventricle: The cavity size was normal. Wall thickness was  normal. Systolic function was normal. The estimated ejection  fraction was in the range of 60% to  65%. Wall motion was normal;  there were no regional wall motion abnormalities. Left  ventricular diastolic function parameters were normal. - Left atrium: The atrium was mildly dilated.  ASSESSMENT AND PLAN:  1.  Paroxysmal atrial fibrillation: Only on Toprol-XL, Eliquis and flecainide.  He is continuing to have palpitations.  We Tyreece Gelles thus increase his flecainide 200 mg.  His blood pressure is elevated today but has been normal on prior checks.  No changes.    This patients CHA2DS2-VASc Score and unadjusted Ischemic Stroke Rate (% per year) is equal to 0.2 % stroke rate/year from a score of 0  Above score calculated as 1 point each if present [CHF, HTN, DM, Vascular=MI/PAD/Aortic Plaque, Age if 65-74, or Male] Above score calculated as 2 points each if present [Age > 75, or Stroke/TIA/TE]  Current medicines are reviewed at length with the patient today.   The patient does not have concerns regarding his medicines.  The following changes were made today:  Increase flecainide  Labs/ tests ordered today include:  Orders Placed This Encounter  Procedures  . EKG 12-Lead     Disposition:   FU with Leodis Alcocer 6 months  Signed, Shakendra Griffeth Jorja LoaMartin Kynzlee Hucker, MD  06/07/2017 4:28 PM     [facility] [address] [place] [address] [phone] (office) [phone] (fax)

## 2017-06-21 ENCOUNTER — Ambulatory Visit: Payer: BLUE CROSS/BLUE SHIELD

## 2017-06-21 ENCOUNTER — Telehealth: Payer: Self-pay | Admitting: *Deleted

## 2017-06-21 NOTE — Telephone Encounter (Signed)
Informed patient to continue what he is doing now, per Dr. Elberta Fortisamnitz. Advised to keep 6 month follow up. Advised to call office if her begins to experience elevated HRs/AFib. Patient verbalized understanding and agreeable to plan.

## 2017-06-21 NOTE — Telephone Encounter (Signed)
lmtcb

## 2017-06-21 NOTE — Telephone Encounter (Signed)
Pt is calling to [facility] and Dr Elberta Fortisamnitz that he cancelled his appt for Nurse Visit check today to assess EKG and BP check, as ordered by Dr Elberta Fortisamnitz.  Pt states he called Monday to cancel this and still noted on his appts that this did not get cancelled.  Pt states he cannot come in for his nurse visit today.  Pt states he tried to increase his Flecainide to 100 mg po bid for 2 days, and this made his chest discomfort worse.  Pt states he has taken it upon himself to decrease his flecainide to taking only 100 mg po daily at bedtime, and he now is completely asymptomatic.  Below is how the pt is self medicating with his cardiac regimen:  Pt taking Flecainide 100 mg po daily at bedtime  Pt taking Toprol XL 25 mg po daily at bedtime.  Reiterated to the pt that this is not how Dr Elberta Fortisamnitz advised him to take his regimen. Advised the pt that he needs to start monitoring his BP and HR at home, and at least mychart those values to Dr Elberta Fortisamnitz for his review.  Pt education provided on how to correctly take his BP and HR.  Informed the pt that I will cancel his [place] appt for today, but I will still need to send this message to Dr Elberta Fortisamnitz and Roanna RaiderSherri RN for further review, and to advise on how the pt should be taking his cardiac med regimen.  Informed the pt that Dr. Elberta Fortisamnitz nurse will follow-up with him shortly thereafter. Pt verbalized understanding and agrees with this plan.

## 2017-09-12 ENCOUNTER — Telehealth: Payer: Self-pay | Admitting: Cardiology

## 2017-09-12 NOTE — Telephone Encounter (Signed)
Pt returned call regarding medications being left at home while on vacation in [place]. Spoke with pharmacy and will prescribe him a 5 day supply of both flecainide and Toprol-XL to get him through until back home on Sunday 09/15/17. Pt made aware of plan and agrees.   Georgie ChardJill McDaniel NP-C [facility] Pager: [phone]

## 2017-09-12 NOTE — Telephone Encounter (Signed)
Received an outpatient call/page from Randall Smith regarding him leaving his flecainide and Toprol medications at home while he is on vacation in [place].  First number received for a [facility] store [phone].  The store is closed given that it is 09/12/2017.  An alternate number was given on the recording for a 24-hour store nearby.  That number is [phone].  Called Randall Smith to verify which pharmacy to use with no answer.  Left a message stating that we are more than happy to call those medications into a local pharmacy where he is at however, I requested that he call us back to let us know which store to call them to.  We will await his return call for pharmacy confirmation.   Randall ChardJill McDaniel NP-C [facility] Pager: [phone]

## 2017-12-04 ENCOUNTER — Ambulatory Visit: Payer: BLUE CROSS/BLUE SHIELD | Admitting: Cardiology

## 2018-04-03 IMAGING — NM NM MISC PROCEDURE
3 series · 18 of 18 positions shown · non-contrast
Comparison: none

[Series 1: rest_(id)_sa · 6.4mm · 6.40mm/px · 6 of 64 frames shown]
[frame 6/64]
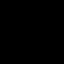
[frame 16/64]
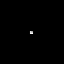
[frame 27/64]
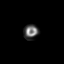
[frame 38/64]
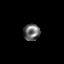
[frame 48/64]
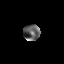
[frame 59/64]
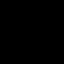

[Series 1: stress-sum-em_(id)_sa · 6.4mm · 6.40mm/px · 6 of 64 frames shown]
[frame 6/64]
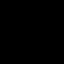
[frame 16/64]
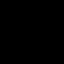
[frame 27/64]
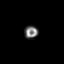
[frame 38/64]
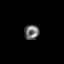
[frame 48/64]
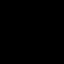
[frame 59/64]
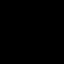

[Series 1: stress-gsp_(id)_sa · 6.4mm · 6.40mm/px · 6 of 512 frames shown]
[frame 43/512]
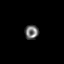
[frame 128/512]
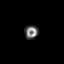
[frame 214/512]
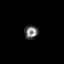
[frame 299/512]
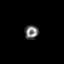
[frame 384/512]
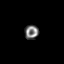
[frame 470/512]
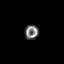

[18 of 18 positions shown; findings below may reference images not displayed]

Canned report from images found in remote index.

Refer to host system for actual result text.

## 2018-05-02 DIAGNOSIS — R7301 Impaired fasting glucose: Secondary | ICD-10-CM | POA: Insufficient documentation

## 2018-05-02 DIAGNOSIS — Z7901 Long term (current) use of anticoagulants: Secondary | ICD-10-CM | POA: Insufficient documentation

## 2018-07-23 ENCOUNTER — Other Ambulatory Visit: Payer: Self-pay | Admitting: Cardiology

## 2019-02-02 ENCOUNTER — Other Ambulatory Visit: Payer: Self-pay | Admitting: Cardiology

## 2019-02-03 NOTE — Telephone Encounter (Signed)
° ° ° °*  STAT* If patient is at the pharmacy, call can be transferred to refill team.   1. Which medications need to be refilled? (please list name of each medication and dose if known) Flecanide  2. Which pharmacy/location (including street and city if local pharmacy) is medication to be sent to?[facility] 3. Do they need a 30 day or 90 day supply? [place]

## 2019-03-04 ENCOUNTER — Encounter: Payer: Self-pay | Admitting: Cardiology

## 2019-03-04 ENCOUNTER — Other Ambulatory Visit: Payer: Self-pay

## 2019-03-04 ENCOUNTER — Ambulatory Visit (INDEPENDENT_AMBULATORY_CARE_PROVIDER_SITE_OTHER): Payer: Self-pay | Admitting: Cardiology

## 2019-03-04 VITALS — BP 152/104 | HR 67 | Ht 73.0 in | Wt 253.4 lb

## 2019-03-04 DIAGNOSIS — I480 Paroxysmal atrial fibrillation: Secondary | ICD-10-CM

## 2019-03-04 MED ORDER — FLECAINIDE ACETATE 100 MG PO TABS: 100.0000 mg | ORAL_TABLET | Freq: Every day | ORAL | 2 refills | Status: DC

## 2019-03-04 NOTE — Progress Notes (Signed)
Electrophysiology Office Note   Date:  03/04/2019   ID:  KELLI EGOLF, DOB 09-27-1965, MRN [id number]  PCP:  Gaspar Garbe, MD  Primary Electrophysiologist:  Regan Lemming, MD    No chief complaint on file.    History of Present Illness: Randall Smith is a 53 y.o. male who presents today for electrophysiology evaluation.   He has a history of palpitations and was diagnosed with atrial fibrillation.  He has occasional dizziness, SOB, and chest pain.  His ECG in his PCP's office shows atrial fibrillation with a rate of 167.  He was put on 25 mg toprol XL and eliquis.  He says that a few years ago, he started having palpitations. He continues to have palpitations on Toprol-XL and was started on flecainide at his last visit. He returns today for atrial fibrillation follow-up.  Today, denies symptoms of palpitations, chest pain, shortness of breath, orthopnea, PND, lower extremity edema, claudication, dizziness, presyncope, syncope, bleeding, or neurologic sequela. The patient is tolerating medications without difficulties.  Overall he is doing well.  He has had minimal symptoms of atrial fibrillation over the past year.  He has been taking his flecainide on a daily basis.  He does note that when he misses doses, he has more episodes of atrial fibrillation.  Past Medical History:  Diagnosis Date  . A-fib (HCC)   . Chest pain   . Dizzy spells   . Elevated glucose   . GERD (gastroesophageal reflux disease)   . Kidney stone   . Low HDL (under 40)   . Lung nodule   . Overweight   . Palpitations   . Racing heart beat   . SOB (shortness of breath)    Past Surgical History:  Procedure Laterality Date  . KNEE SURGERY Left 1996     Current Outpatient Medications  Medication Sig Dispense Refill  . flecainide (TAMBOCOR) 100 MG tablet Take 1 tablet (100 mg total) by mouth 2 (two) times daily. Please keep upcoming appt with Dr. Elberta Fortis in December before anymore refills. Thank you  180 tablet 0  . metoprolol succinate (TOPROL-XL) 25 MG 24 hr tablet Take 25 mg by mouth daily.     No current facility-administered medications for this visit.    Allergies:   Patient has no known allergies.   Social History:  The patient  reports that he has never smoked. He has never used smokeless tobacco. He reports that he does not drink alcohol or use drugs.   Family History:  The patient's family history includes CAD in his maternal aunt; CVA in his mother; Cancer in his father; Heart attack in his father; Heart attack (age of onset: 40) in his maternal grandfather; Heart disease in his maternal grandmother and paternal grandmother.    ROS:  Please see the history of present illness.   Otherwise, review of systems is positive for none.   All other systems are reviewed and negative.   PHYSICAL EXAM: VS:  There were no vitals taken for this visit. , BMI There is no height or weight on file to calculate BMI. GEN: Well nourished, well developed, in no acute distress  HEENT: normal  Neck: no JVD, carotid bruits, or masses Cardiac: RRR; no murmurs, rubs, or gallops,no edema  Respiratory:  clear to auscultation bilaterally, normal work of breathing GI: soft, nontender, nondistended, + BS MS: no deformity or atrophy  Skin: warm and dry Neuro:  Strength and sensation are intact Psych: euthymic mood,  full affect  EKG:  EKG is ordered today. Personal review of the ekg ordered shows sinus rhythm, rate 67   Recent Labs: No results found for requested labs within last 8760 hours.    Lipid Panel  No results found for: CHOL, TRIG, HDL, CHOLHDL, VLDL, LDLCALC, LDLDIRECT   Wt Readings from Last 3 Encounters:  06/07/17 238 lb (108 kg)  11/28/16 236 lb (107 kg)  09/28/15 231 lb 6.4 oz (105 kg)      Other studies Reviewed: Additional studies/ records that were reviewed today include: TTE 07/14/15 - Left ventricle: The cavity size was normal. Wall thickness was  normal. Systolic  function was normal. The estimated ejection  fraction was in the range of 60% to 65%. Wall motion was normal;  there were no regional wall motion abnormalities. Left  ventricular diastolic function parameters were normal. - Left atrium: The atrium was mildly dilated.  ASSESSMENT AND PLAN:  1.  Paroxysmal atrial fibrillation: Currently on Toprol-XL,  flecainide.  CHA2DS2-VASc of 0.  He is taking the flecainide on a daily basis.  He is having minimal episodes of atrial fibrillation.  No changes at this time.  2.  Elevated blood pressure: It is unclear to me whether or not this is due to whitecoat hypertension versus true hypertension.  I have asked him to get a blood pressure cuff to check at home.  Current medicines are reviewed at length with the patient today.   The patient does not have concerns regarding his medicines.  The following changes were made today: None  Labs/ tests ordered today include:  No orders of the defined types were placed in this encounter.    Disposition:   FU with Ebba Goll 6 months  Signed, Sanjuanita Condrey Meredith Leeds, MD  03/04/2019 11:39 AM     [facility] [address] [place] [address] [phone] (office) [phone] (fax)

## 2019-03-04 NOTE — Patient Instructions (Signed)
Medication Instructions:  Your physician recommends that you continue on your current medications as directed. Please refer to the Current Medication list given to you today.  * If you need a refill on your cardiac medications before your next appointment, please call your pharmacy.   Labwork: None ordered  Testing/Procedures: None ordered  Follow-Up: At [facility], you and your health needs are our priority.  As part of our continuing mission to provide you with exceptional heart care, we have created designated Provider Care Teams.  These Care Teams include your primary Cardiologist (physician) and Advanced Practice Providers (APPs -  Physician Assistants and Nurse Practitioners) who all work together to provide you with the care you need, when you need it.  You will need a follow up appointment in 6 months.  Please call our office 2 months in advance to schedule this appointment.  You may see Dr Curt Bears or one of the following Advanced Practice Providers on your designated Care Team:    Chanetta Marshall, NP  Tommye Standard, PA-C  Oda Kilts, [place]  Thank you for choosing [facility]!!   Trinidad Curet, RN [phone]  Any Other Special Instructions Will Be Listed Below (If Applicable).  Normal blood pressure is 120/30.  Would like the top number to be 130s or below

## 2019-11-24 ENCOUNTER — Other Ambulatory Visit: Payer: Self-pay

## 2019-11-24 ENCOUNTER — Ambulatory Visit: Payer: 59 | Admitting: Cardiology

## 2019-11-24 ENCOUNTER — Encounter: Payer: Self-pay | Admitting: Cardiology

## 2019-11-24 VITALS — BP 134/80 | HR 68 | Ht 73.0 in | Wt 245.0 lb

## 2019-11-24 DIAGNOSIS — I480 Paroxysmal atrial fibrillation: Secondary | ICD-10-CM

## 2019-11-24 MED ORDER — DILTIAZEM HCL ER COATED BEADS 120 MG PO CP24: 120.0000 mg | ORAL_CAPSULE | Freq: Every day | ORAL | 6 refills | Status: DC

## 2019-11-24 NOTE — Progress Notes (Signed)
Electrophysiology Office Note   Date:  11/24/2019   [name], DOB 12-31-1965, MRN [id number]  PCP:  Gaspar Garbe, MD  Primary Electrophysiologist:  Regan Lemming, MD    No chief complaint on file.    History of Present Illness: Randall Smith is a 54 y.o. male who presents today for electrophysiology evaluation.   He has a history of palpitations and was diagnosed with atrial fibrillation.  He has occasional dizziness, SOB, and chest pain.  His ECG in his PCP's office shows atrial fibrillation with a rate of 167.  He was put on 25 mg toprol XL and eliquis.  He says that a few years ago, he started having palpitations. He continues to have palpitations on Toprol-XL and was started on flecainide at his last visit. He returns today for atrial fibrillation follow-up.  Today, denies symptoms of palpitations, chest pain, shortness of breath, orthopnea, PND, lower extremity edema, claudication, dizziness, presyncope, syncope, bleeding, or neurologic sequela. The patient is tolerating medications without difficulties.  Since last being seen he has done well.  He has minimal palpitations.  He continues to take his flecainide on a daily basis as well as his metoprolol.  He is comfortable with his overall control of atrial fibrillation.  He does state that he is having some arthralgias in his hands and knees.  He is unclear whether or not this is due to arthritis or some other cause.  He has heard from some of his friends that the metoprolol can cause these issues.  Past Medical History:  Diagnosis Date  . A-fib (HCC)   . Chest pain   . Dizzy spells   . Elevated glucose   . GERD (gastroesophageal reflux disease)   . Kidney stone   . Low HDL (under 40)   . Lung nodule   . Overweight   . Palpitations   . Racing heart beat   . SOB (shortness of breath)    Past Surgical History:  Procedure Laterality Date  . KNEE SURGERY Left 1996     Current Outpatient Medications   Medication Sig Dispense Refill  . flecainide (TAMBOCOR) 100 MG tablet Take 1 tablet (100 mg total) by mouth daily. 90 tablet 2  . diltiazem (CARDIZEM CD) 120 MG 24 hr capsule Take 1 capsule (120 mg total) by mouth daily. 30 capsule 6   No current facility-administered medications for this visit.    Allergies:   Patient has no known allergies.   Social History:  The patient  reports that he has never smoked. He has never used smokeless tobacco. He reports that he does not drink alcohol and does not use drugs.   Family History:  The patient's family history includes CAD in his maternal aunt; CVA in his mother; Cancer in his father; Heart attack in his father; Heart attack (age of onset: 60) in his maternal grandfather; Heart disease in his maternal grandmother and paternal grandmother.    ROS:  Please see the history of present illness.   Otherwise, review of systems is positive for none.   All other systems are reviewed and negative.   PHYSICAL EXAM: VS:  BP 134/80   Pulse 68   Ht 6\' 1"  (1.854 m)   Wt 245 lb (111.1 kg)   SpO2 98%   BMI 32.32 kg/m  , BMI Body mass index is 32.32 kg/m. GEN: Well nourished, well developed, in no acute distress  HEENT: normal  Neck: no JVD, carotid  bruits, or masses Cardiac: RRR; no murmurs, rubs, or gallops,no edema  Respiratory:  clear to auscultation bilaterally, normal work of breathing GI: soft, nontender, nondistended, + BS MS: no deformity or atrophy  Skin: warm and dry Neuro:  Strength and sensation are intact Psych: euthymic mood, full affect  EKG:  EKG is ordered today. Personal review of the ekg ordered shows sinus rhythm, rate 68   Recent Labs: No results found for requested labs within last 8760 hours.    Lipid Panel  No results found for: CHOL, TRIG, HDL, CHOLHDL, VLDL, LDLCALC, LDLDIRECT   Wt Readings from Last 3 Encounters:  11/24/19 245 lb (111.1 kg)  03/04/19 253 lb 6.4 oz (114.9 kg)  06/07/17 238 lb (108 kg)       Other studies Reviewed: Additional studies/ records that were reviewed today include: TTE 07/14/15 - Left ventricle: The cavity size was normal. Wall thickness was  normal. Systolic function was normal. The estimated ejection  fraction was in the range of 60% to 65%. Wall motion was normal;  there were no regional wall motion abnormalities. Left  ventricular diastolic function parameters were normal. - Left atrium: The atrium was mildly dilated.  ASSESSMENT AND PLAN:  1.  Paroxysmal atrial fibrillation: Currently on Toprol-XL and flecainide (ECG monitoring done for this high risk medication).  CHA2DS2-VASc of 0.  He is having some arthralgias in his hands which is a side effect of metoprolol and less than 1% of people.  He would like to try an alternative medication.  We will stop metoprolol and start 120 of diltiazem.  2.  Elevated blood pressure: More normal today.  Is normal at home.  No changes.  Current medicines are reviewed at length with the patient today.   The patient does not have concerns regarding his medicines.  The following changes were made today: Stop metoprolol, start diltiazem  Labs/ tests ordered today include:  Orders Placed This Encounter  Procedures  . EKG 12-Lead     Disposition:   FU with Will Camnitz 6 months  Signed, Will Jorja Loa, MD  11/24/2019 1:54 PM     [facility] [address] [place] [address] [phone] (office) [phone] (fax)

## 2019-11-24 NOTE — Patient Instructions (Signed)
Medication Instructions:  Your physician has recommended you make the following change in your medication:  1. STOP Metoprolol 2. START Diltiazem 120 mg once daily  *If you need a refill on your cardiac medications before your next appointment, please call your pharmacy*   Lab Work: None ordered   Testing/Procedures: None ordered   Follow-Up: At [facility], you and your health needs are our priority.  As part of our continuing mission to provide you with exceptional heart care, we have created designated Provider Care Teams.  These Care Teams include your primary Cardiologist (physician) and Advanced Practice Providers (APPs -  Physician Assistants and Nurse Practitioners) who all work together to provide you with the care you need, when you need it.  We recommend signing up for the patient portal called "MyChart".  Sign up information is provided on this After Visit Summary.  MyChart is used to connect with patients for Virtual Visits (Telemedicine).  Patients are able to view lab/test results, encounter notes, upcoming appointments, etc.  Non-urgent messages can be sent to your provider as well.   To learn more about what you can do with MyChart, go to [email or web address].    Your next appointment:   6 month(s)  The format for your next appointment:   In Person  Provider:   You will see one of the following Advanced Practice Providers on your designated Care Team:    Gypsy Balsam, NP  Francis Dowse, PA-C  Casimiro Needle "[place]" [place], [place]    Thank you for choosing [facility]!!   Dory Horn, RN [phone]    Other Instructions

## 2020-02-09 ENCOUNTER — Other Ambulatory Visit: Payer: Self-pay | Admitting: Adult Health

## 2020-02-09 ENCOUNTER — Ambulatory Visit (HOSPITAL_COMMUNITY)
Admission: RE | Admit: 2020-02-09 | Discharge: 2020-02-09 | Disposition: A | Discharge status: Home / Self Care | Payer: 59 | Source: Ambulatory Visit | Attending: Pulmonary Disease | Admitting: Pulmonary Disease

## 2020-02-09 ENCOUNTER — Telehealth (HOSPITAL_COMMUNITY): Payer: Self-pay | Admitting: Emergency Medicine

## 2020-02-09 DIAGNOSIS — U071 COVID-19: Secondary | ICD-10-CM

## 2020-02-09 DIAGNOSIS — Z6825 Body mass index (BMI) 25.0-25.9, adult: Secondary | ICD-10-CM | POA: Diagnosis not present

## 2020-02-09 DIAGNOSIS — I10 Essential (primary) hypertension: Secondary | ICD-10-CM | POA: Diagnosis not present

## 2020-02-09 MED ORDER — SOTROVIMAB 500 MG/8ML IV SOLN
500.0000 mg | Freq: Once | INTRAVENOUS | Status: AC
  Administered 2020-02-09: 500 mg via INTRAVENOUS

## 2020-02-09 MED ORDER — METHYLPREDNISOLONE SODIUM SUCC 125 MG IJ SOLR: 125.0000 mg | Freq: Once | INTRAMUSCULAR | Status: DC | PRN

## 2020-02-09 MED ORDER — SODIUM CHLORIDE 0.9 % IV SOLN: INTRAVENOUS | Status: DC | PRN

## 2020-02-09 MED ORDER — FAMOTIDINE IN NACL 20-0.9 MG/50ML-% IV SOLN: 20.0000 mg | Freq: Once | INTRAVENOUS | Status: DC | PRN

## 2020-02-09 MED ORDER — DIPHENHYDRAMINE HCL 50 MG/ML IJ SOLN: 50.0000 mg | Freq: Once | INTRAMUSCULAR | Status: DC | PRN

## 2020-02-09 MED ORDER — EPINEPHRINE 0.3 MG/0.3ML IJ SOAJ: 0.3000 mg | Freq: Once | INTRAMUSCULAR | Status: DC | PRN

## 2020-02-09 MED ORDER — ALBUTEROL SULFATE HFA 108 (90 BASE) MCG/ACT IN AERS: 2.0000 | INHALATION_SPRAY | Freq: Once | RESPIRATORY_TRACT | Status: DC | PRN

## 2020-02-09 NOTE — Discharge Instructions (Signed)
10 Things You Can Do to Manage Your COVID-19 Symptoms at Home If you have possible or confirmed COVID-19: 1. Stay home from work and school. And stay away from other public places. If you must go out, avoid using any kind of public transportation, ridesharing, or taxis. 2. Monitor your symptoms carefully. If your symptoms get worse, call your healthcare provider immediately. 3. Get rest and stay hydrated. 4. If you have a medical appointment, call the healthcare provider ahead of time and tell them that you have or may have COVID-19. 5. For medical emergencies, call 911 and notify the dispatch personnel that you have or may have COVID-19. 6. Cover your cough and sneezes with a tissue or use the inside of your elbow. 7. Wash your hands often with soap and water for at least 20 seconds or clean your hands with an alcohol-based hand sanitizer that contains at least 60% alcohol. 8. As much as possible, stay in a specific room and away from other people in your home. Also, you should use a separate bathroom, if available. If you need to be around other people in or outside of the home, wear a mask. 9. Avoid sharing personal items with other people in your household, like dishes, towels, and bedding. 10. Clean all surfaces that are touched often, like counters, tabletops, and doorknobs. Use household cleaning sprays or wipes according to the label instructions. cdc.gov/coronavirus 09/10/2018 This information is not intended to replace advice given to you by your health care provider. Make sure you discuss any questions you have with your health care provider. Document Revised: 02/12/2019 Document Reviewed: 02/12/2019 Elsevier Patient Education  2020 Elsevier Inc.  What types of side effects do monoclonal antibody drugs cause?  Common side effects  In general, the more common side effects caused by monoclonal antibody drugs include: . Allergic reactions, such as hives or itching . Flu-like signs and  symptoms, including chills, fatigue, fever, and muscle aches and pains . Nausea, vomiting . Diarrhea . Skin rashes . Low blood pressure   The CDC is recommending patients who receive monoclonal antibody treatments wait at least 90 days before being vaccinated.  Currently, there are no data on the safety and efficacy of mRNA COVID-19 vaccines in persons who received monoclonal antibodies or convalescent plasma as part of COVID-19 treatment. Based on the estimated half-life of such therapies as well as evidence suggesting that reinfection is uncommon in the 90 days after initial infection, vaccination should be deferred for at least 90 days, as a precautionary measure until additional information becomes available, to avoid interference of the antibody treatment with vaccine-induced immune responses.  If you have any questions or concerns after the infusion please call the Advanced Practice Provider on call at [phone]. This number is only intended for your use regarding questions or concerns about the infusion post-treatment side-effects.  Please do not provide this number to others for use.   If someone you know is interested in receiving treatment please have them call the COVID hotline at [phone].   

## 2020-02-09 NOTE — Telephone Encounter (Signed)
Called pt and explained possible monoclonal antibody treatment. Sx started 11/25. Tested positive 11/26 at [facility] in [place]. Sx include headache, head congestion, chills, skin sensitive to touch, diarrhea, and weakness. Qualifying risk factors include a-fib and BMI is 31.7. Pt is not vaccinated. Pt interested in tx. Informed pt an APP will call back to possibly schedule an appointment.

## 2020-02-09 NOTE — Progress Notes (Signed)
Patient reviewed Fact Sheet for Patients, Parents, and Caregivers for Emergency Use Authorization (EUA) of Sotrovimab for the Treatment of Coronavirus. Patient also reviewed and is agreeable to the estimated cost of treatment. Patient is agreeable to proceed.   

## 2020-02-09 NOTE — Progress Notes (Signed)
I connected by phone with Randall Smith on 02/09/2020 at 11:04 AM to discuss the potential use of a new treatment for mild to moderate COVID-19 viral infection in non-hospitalized patients.  This patient is a 54 y.o. male that meets the FDA criteria for Emergency Use Authorization of COVID monoclonal antibody casirivimab/imdevimab, bamlanivimab/eteseviamb, or sotrovimab.  Has a (+) direct SARS-CoV-2 viral test result  Has mild or moderate COVID-19   Is NOT hospitalized due to COVID-19  Is within 10 days of symptom onset  Has at least one of the high risk factor(s) for progression to severe COVID-19 and/or hospitalization as defined in EUA.  Specific high risk criteria : BMI > 25   I have spoken and communicated the following to the patient or parent/caregiver regarding COVID monoclonal antibody treatment:  1. FDA has authorized the emergency use for the treatment of mild to moderate COVID-19 in adults and pediatric patients with positive results of direct SARS-CoV-2 viral testing who are 63 years of age and older weighing at least 40 kg, and who are at high risk for progressing to severe COVID-19 and/or hospitalization.  2. The significant known and potential risks and benefits of COVID monoclonal antibody, and the extent to which such potential risks and benefits are unknown.  3. Information on available alternative treatments and the risks and benefits of those alternatives, including clinical trials.  4. Patients treated with COVID monoclonal antibody should continue to self-isolate and use infection control measures (e.g., wear mask, isolate, social distance, avoid sharing personal items, clean and disinfect "high touch" surfaces, and frequent handwashing) according to CDC guidelines.   5. The patient or parent/caregiver has the option to accept or refuse COVID monoclonal antibody treatment. 6. Cost reviewed.  Sx onset 11/25  After reviewing this information with the patient, the  patient has agreed to receive one of the available covid 19 monoclonal antibodies and will be provided an appropriate fact sheet prior to infusion. Noreene Filbert, NP 02/09/2020 11:04 AM

## 2020-02-09 NOTE — Progress Notes (Signed)
Diagnosis: COVID-19  Physician: Dr. Patrick Wright  Procedure: Covid Infusion Clinic Med: Sotrovimab infusion - Provided patient with sotrovimab fact sheet for patients, parents, and caregivers prior to infusion.   Complications: No immediate complications noted  Discharge: Discharged home    

## 2020-02-11 DIAGNOSIS — Z8616 Personal history of COVID-19: Secondary | ICD-10-CM | POA: Insufficient documentation

## 2020-04-26 ENCOUNTER — Other Ambulatory Visit: Payer: Self-pay | Admitting: Cardiology

## 2020-05-22 NOTE — Progress Notes (Signed)
PCP:  Gaspar Garbe, MD Primary Cardiologist: Will Jorja Loa, MD Electrophysiologist: Regan Lemming, MD   Randall Smith is a 55 y.o. male seen today for Will Jorja Loa, MD for routine electrophysiology followup.  Since last being seen in our clinic the patient reports doing very well. he denies chest pain, palpitations, dyspnea, PND, orthopnea, nausea, vomiting, dizziness, syncope, edema, weight gain, or early satiety. He does have occasional "gas discomfort" that is relieved with belching.   Past Medical History:  Diagnosis Date  . A-fib (HCC)   . Chest pain   . Dizzy spells   . Elevated glucose   . GERD (gastroesophageal reflux disease)   . Kidney stone   . Low HDL (under 40)   . Lung nodule   . Overweight   . Palpitations   . Racing heart beat   . SOB (shortness of breath)    Past Surgical History:  Procedure Laterality Date  . KNEE SURGERY Left 1996    Current Outpatient Medications  Medication Sig Dispense Refill  . flecainide (TAMBOCOR) 100 MG tablet TAKE 1 TABLET BY MOUTH DAILY 90 tablet 2  . Metoprolol Succinate 25 MG CS24 Take 1 tablet by mouth daily.     No current facility-administered medications for this visit.    No Known Allergies  Social History   Socioeconomic History  . Marital status: Married    Spouse name: Not on file  . Number of children: Not on file  . Years of education: Not on file  . Highest education level: Not on file  Occupational History  . Not on file  Tobacco Use  . Smoking status: Never Smoker  . Smokeless tobacco: Never Used  Substance and Sexual Activity  . Alcohol use: No  . Drug use: No  . Sexual activity: Not on file  Other Topics Concern  . Not on file  Social History Narrative  . Not on file   Social Determinants of Health   Financial Resource Strain: Not on file  Food Insecurity: Not on file  Transportation Needs: Not on file  Physical Activity: Not on file  Stress: Not on file  Social  Connections: Not on file  Intimate Partner Violence: Not on file     Review of Systems: General: No chills, fever, night sweats or weight changes  Cardiovascular:  No chest pain, dyspnea on exertion, edema, orthopnea, palpitations, paroxysmal nocturnal dyspnea Dermatological: No rash, lesions or masses Respiratory: No cough, dyspnea Urologic: No hematuria, dysuria Abdominal: No nausea, vomiting, diarrhea, bright red blood per rectum, melena, or hematemesis Neurologic: No visual changes, weakness, changes in mental status All other systems reviewed and are otherwise negative except as noted above.  Physical Exam: Vitals:   05/23/20 0830  BP: 140/80  Pulse: 62  SpO2: 98%  Weight: 249 lb 12.8 oz (113.3 kg)  Height: 6\' 1"  (1.854 m)    GEN- The patient is well appearing, alert and oriented x 3 today.   HEENT: normocephalic, atraumatic; sclera clear, conjunctiva pink; hearing intact; oropharynx clear; neck supple, no JVP Lymph- no cervical lymphadenopathy Lungs- Clear to ausculation bilaterally, normal work of breathing.  No wheezes, rales, rhonchi Heart- Regular rate and rhythm, no murmurs, rubs or gallops, PMI not laterally displaced GI- soft, non-tender, non-distended, bowel sounds present, no hepatosplenomegaly Extremities- no clubbing, cyanosis, or edema; DP/PT/radial pulses 2+ bilaterally MS- no significant deformity or atrophy Skin- warm and dry, no rash or lesion Psych- euthymic mood, full affect Neuro- strength and  sensation are intact  EKG is ordered. Personal review of EKG from today shows NSR at 62 bpm, QRS 88 ms on Flecainide  Additional studies reviewed include: Previous EP notes  Assessment and Plan:  1. Paroxysmal AF Continue toprol and flecainide.  CHA2DS2VASC of 1. (HTN)  Not on OAC at this time. We did discuss that as he got older or gained more risk factors his stroke risk would increase as would the recommendation for whether or not to start [facility].   2.  HTN Continue current medications  Graciella Freer, [place]  05/23/20 8:44 AM

## 2020-05-23 ENCOUNTER — Encounter: Payer: Self-pay | Admitting: Student

## 2020-05-23 ENCOUNTER — Ambulatory Visit: Payer: 59 | Admitting: Student

## 2020-05-23 ENCOUNTER — Other Ambulatory Visit: Payer: Self-pay

## 2020-05-23 VITALS — BP 140/80 | HR 62 | Ht 73.0 in | Wt 249.8 lb

## 2020-05-23 DIAGNOSIS — I480 Paroxysmal atrial fibrillation: Secondary | ICD-10-CM | POA: Diagnosis not present

## 2020-05-23 DIAGNOSIS — I10 Essential (primary) hypertension: Secondary | ICD-10-CM

## 2020-05-23 LAB — BASIC METABOLIC PANEL WITH GFR
BUN/Creatinine Ratio: 16 (ref 9–20)
BUN: 18 mg/dL (ref 6–24)
CO2: 22 mmol/L (ref 20–29)
Calcium: 9.6 mg/dL (ref 8.7–10.2)
Chloride: 101 mmol/L (ref 96–106)
Creatinine, Ser: 1.11 mg/dL (ref 0.76–1.27)
Glucose: 104 mg/dL — ABNORMAL HIGH (ref 65–99)
Potassium: 4.1 mmol/L (ref 3.5–5.2)
Sodium: 139 mmol/L (ref 134–144)
eGFR: 79 mL/min/1.73

## 2020-05-23 LAB — MAGNESIUM: Magnesium: 1.9 mg/dL (ref 1.6–2.3)

## 2020-05-23 NOTE — Patient Instructions (Signed)
Medication Instructions:  Your physician recommends that you continue on your current medications as directed. Please refer to the Current Medication list given to you today.  *If you need a refill on your cardiac medications before your next appointment, please call your pharmacy*   Lab Work: TODAY: BMET, Mag If you have labs (blood work) drawn today and your tests are completely normal, you will receive your results only by: Marland Kitchen MyChart Message (if you have MyChart) OR . A paper copy in the mail If you have any lab test that is abnormal or we need to change your treatment, we will call you to review the results.   Follow-Up: At [facility], you and your health needs are our priority.  As part of our continuing mission to provide you with exceptional heart care, we have created designated Provider Care Teams.  These Care Teams include your primary Cardiologist (physician) and Advanced Practice Providers (APPs -  Physician Assistants and Nurse Practitioners) who all work together to provide you with the care you need, when you need it.  We recommend signing up for the patient portal called "MyChart".  Sign up information is provided on this After Visit Summary.  MyChart is used to connect with patients for Virtual Visits (Telemedicine).  Patients are able to view lab/test results, encounter notes, upcoming appointments, etc.  Non-urgent messages can be sent to your provider as well.   To learn more about what you can do with MyChart, go to [email or web address].    Your next appointment:   6 month(s)  The format for your next appointment:   In Person  Provider:   You may see Will Jorja Loa, MD or one of the following Advanced Practice Providers on your designated Care Team:    Gypsy Balsam, NP  Francis Dowse, PA-C  Casimiro Needle [facility] [place], [place]

## 2021-01-26 ENCOUNTER — Other Ambulatory Visit: Payer: Self-pay

## 2021-01-26 MED ORDER — FLECAINIDE ACETATE 100 MG PO TABS
100.0000 mg | ORAL_TABLET | Freq: Every day | ORAL | 1 refills | Status: DC
Start: 2021-01-26 — End: 2021-07-31

## 2021-01-26 MED ORDER — METOPROLOL SUCCINATE ER 25 MG PO TB24
25.0000 mg | ORAL_TABLET | Freq: Every day | ORAL | 1 refills | Status: DC
Start: 2021-01-26 — End: 2021-09-06

## 2021-01-26 NOTE — Telephone Encounter (Signed)
Pt's medication was sent to pt's pharmacy as requested. Confirmation received.  °

## 2021-07-31 ENCOUNTER — Other Ambulatory Visit: Payer: Self-pay

## 2021-07-31 MED ORDER — FLECAINIDE ACETATE 100 MG PO TABS: 100.0000 mg | ORAL_TABLET | Freq: Every day | ORAL | 0 refills | Status: DC

## 2021-08-30 ENCOUNTER — Telehealth: Payer: Self-pay | Admitting: Cardiology

## 2021-08-30 MED ORDER — FLECAINIDE ACETATE 100 MG PO TABS: 100.0000 mg | ORAL_TABLET | Freq: Every day | ORAL | 0 refills | Status: DC

## 2021-08-30 NOTE — Telephone Encounter (Signed)
*  STAT* If patient is at the pharmacy, call can be transferred to refill team.   1. Which medications need to be refilled? (please list name of each medication and dose if known) Flecainide  2. Which pharmacy/location (including street and city if local pharmacy) is medication to be sent to? [facility] [place]  3. Do they need a 30 day or 90 day supply? Enough until 09-06-21- need this asap

## 2021-08-30 NOTE — Telephone Encounter (Signed)
Pt's medication was sent to pt's pharmacy as requested. Confirmation received.  °

## 2021-09-03 NOTE — Progress Notes (Signed)
Cardiology Office Note Date:  09/06/2021  Patient [name], Randall Smith 1965-07-25, MRN [id number] PCP:  Gaspar Garbe, MD  Electrophysiologist: Dr Marland Mcalpine    Chief Complaint:  Annual visit, needs refills  History of Present Illness: Randall Smith is a 56 y.o. male with history of GERD, HTN, Afib.  He comes in today to be seen for Dr. Elberta Fortis, last seen by him Sept 2021, at that time doing well, though c/o some arthralgia type complaints, perhaps the metoprolol and changed to dilt.  Maintained in daily dose of flecainide.  She saw A. Natasha Bence March 2022, doing well with some gas discomfort that resolved with belching Not on OAC with risk score of one  TODAY He feels quite well He can tell when he is having Afib, feels "off" not as well as usual, and feels like he has had only a couple brief episodes. Feels quite well on his current dosing regime. 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 No CP, SOB, DOE No near syncope or syncope. Occassionally notes some swelling lower legs/feet  He just saw his PMD for annual physical, BP, labs all were good   AFib/AAD hx Diagnosed 2017 ? Flecainide started July 2017  Past Medical History:  Diagnosis Date   A-fib [facility])    Chest pain    Dizzy spells    Elevated glucose    GERD (gastroesophageal reflux disease)    Kidney stone    Low HDL (under 40)    Lung nodule    Overweight    Palpitations    Racing heart beat    SOB (shortness of breath)     Past Surgical History:  Procedure Laterality Date   KNEE SURGERY Left 1996    Current Outpatient Medications  Medication Sig Dispense Refill   flecainide (TAMBOCOR) 100 MG tablet Take 1 tablet (100 mg total) by mouth daily. Pt must keep upcoming appt in June 2023 with Cardiologist before anymore refills. Thank you Final Attempt 90 tablet 3   metoprolol succinate (TOPROL-XL) 25 MG 24 hr tablet Take 1 tablet (25 mg total) by mouth  daily. 90 tablet 3   No current facility-administered medications for this visit.    Allergies:   Patient has no known allergies.   Social History:  The patient  reports that he has never smoked. He has never used smokeless tobacco. He reports that he does not drink alcohol and does not use drugs.   Family History:  The patient's family history includes CAD in his maternal aunt; CVA in his mother; Cancer in his father; Heart attack in his father; Heart attack (age of onset: 79) in his maternal grandfather; Heart disease in his maternal grandmother and paternal grandmother.  ROS:  Please see the history of present illness.    All other systems are reviewed and otherwise negative.   PHYSICAL EXAM:  VS:  BP (!) 144/92   Pulse (!) 58   Ht 6\' 1"  (1.854 m)   Wt 254 lb 6.4 oz (115.4 kg)   SpO2 98%   BMI 33.56 kg/m  BMI: Body mass index is 33.56 kg/m. Well nourished, well developed, in no acute distress HEENT: normocephalic, atraumatic Neck: no JVD, carotid bruits or masses Cardiac:  RRR; no significant murmurs, no rubs, or gallops Lungs:  CTA b/l, no wheezing, rhonchi or rales Abd: soft, nontender MS: no deformity or atrophy Ext: trace edema Skin: warm and dry, no rash Neuro:  No gross deficits appreciated Psych: euthymic mood, full affect    EKG:  Done today and reviewed by myself shows  SB 58bpm, PR , QRS 35ms, Qtc  10/20/2015: stress myoview Nuclear stress EF: 61%. There was no ST segment deviation noted during stress. No T wave inversion was noted during stress. This is a low risk study. The left ventricular ejection fraction is normal (55-65%).   07/14/2015: TTE Study Conclusions  - Left ventricle: The cavity size was normal. Wall thickness was    normal. Systolic function was normal. The estimated ejection    fraction was in the range of 60% to 65%. Wall motion was normal;    there were no regional wall motion abnormalities. Left    ventricular diastolic  function parameters were normal.  - Left atrium: The atrium was mildly dilated.   Recent Labs: No results found for requested labs within last 365 days.  No results found for requested labs within last 365 days.   CrCl cannot be calculated (Patient's most recent lab result is older than the maximum 21 days allowed.).   Wt Readings from Last 3 Encounters:  09/06/21 254 lb 6.4 oz (115.4 kg)  05/23/20 249 lb 12.8 oz (113.3 kg)  11/24/19 245 lb (111.1 kg)     Other studies reviewed: Additional studies/records reviewed today include: summarized above  ASSESSMENT AND PLAN:  Paroxysmal Afib CHA2DS2Vasc is one , no new medial history flecainide and Toprol Stable intervals minimal burden by symptoms  2.   HTN A bit high 3. Slight edema Advised to reduce his sodium intake, sounds like he has plenty of room for improvement with this. Follow with his PMD   Disposition: F/u with [place] annually as he has been, sooner if needed  Current medicines are reviewed at length with the patient today.  The patient did not have any concerns regarding medicines.  Norma Fredrickson, PA-C 09/06/2021 8:41 AM     [facility] [address] [place] [address] [phone] (office)  [phone] (fax)

## 2021-09-06 ENCOUNTER — Encounter: Payer: Self-pay | Admitting: Physician Assistant

## 2021-09-06 ENCOUNTER — Ambulatory Visit: Payer: 59 | Admitting: Physician Assistant

## 2021-09-06 VITALS — BP 144/92 | HR 58 | Ht 73.0 in | Wt 254.4 lb

## 2021-09-06 DIAGNOSIS — Z5181 Encounter for therapeutic drug level monitoring: Secondary | ICD-10-CM

## 2021-09-06 DIAGNOSIS — Z79899 Other long term (current) drug therapy: Secondary | ICD-10-CM | POA: Diagnosis not present

## 2021-09-06 DIAGNOSIS — I480 Paroxysmal atrial fibrillation: Secondary | ICD-10-CM

## 2021-09-06 DIAGNOSIS — I10 Essential (primary) hypertension: Secondary | ICD-10-CM | POA: Diagnosis not present

## 2021-09-06 MED ORDER — FLECAINIDE ACETATE 100 MG PO TABS: 100.0000 mg | ORAL_TABLET | Freq: Every day | ORAL | 3 refills | Status: DC

## 2021-09-06 MED ORDER — METOPROLOL SUCCINATE ER 25 MG PO TB24: 25.0000 mg | ORAL_TABLET | Freq: Every day | ORAL | 3 refills | Status: DC

## 2021-09-06 NOTE — Patient Instructions (Addendum)
Medication Instructions:  Your physician recommends that you continue on your current medications as directed. Please refer to the Current Medication list given to you today.  *If you need a refill on your cardiac medications before your next appointment, please call your pharmacy*   Lab Work: None ordered  If you have labs (blood work) drawn today and your tests are completely normal, you will receive your results only by: MyChart Message (if you have MyChart) OR A paper copy in the mail If you have any lab test that is abnormal or we need to change your treatment, we will call you to review the results.   Testing/Procedures: None ordered   Follow-Up: At [facility], you and your health needs are our priority.  As part of our continuing mission to provide you with exceptional heart care, we have created designated Provider Care Teams.  These Care Teams include your primary Cardiologist (physician) and Advanced Practice Providers (APPs -  Physician Assistants and Nurse Practitioners) who all work together to provide you with the care you need, when you need it.  We recommend signing up for the patient portal called "MyChart".  Sign up information is provided on this After Visit Summary.  MyChart is used to connect with patients for Virtual Visits (Telemedicine).  Patients are able to view lab/test results, encounter notes, upcoming appointments, etc.  Non-urgent messages can be sent to your provider as well.   To learn more about what you can do with MyChart, go to [email or web address].    Your next appointment:   12 month(s)  The format for your next appointment:   In Person  Provider:   You may see Will Jorja Loa, MD or one of the following Advanced Practice Providers on your designated Care Team:      Other Instructions   Important Information About Sugar

## 2022-08-31 ENCOUNTER — Ambulatory Visit: Payer: 59 | Attending: Student | Admitting: Cardiology

## 2022-08-31 ENCOUNTER — Encounter: Payer: Self-pay | Admitting: Cardiology

## 2022-08-31 VITALS — BP 154/94 | HR 66 | Ht 73.0 in | Wt 253.0 lb

## 2022-08-31 DIAGNOSIS — I10 Essential (primary) hypertension: Secondary | ICD-10-CM

## 2022-08-31 DIAGNOSIS — I480 Paroxysmal atrial fibrillation: Secondary | ICD-10-CM | POA: Diagnosis not present

## 2022-08-31 DIAGNOSIS — Z5181 Encounter for therapeutic drug level monitoring: Secondary | ICD-10-CM

## 2022-08-31 DIAGNOSIS — Z79899 Other long term (current) drug therapy: Secondary | ICD-10-CM

## 2022-08-31 MED ORDER — METOPROLOL SUCCINATE ER 25 MG PO TB24: 25.0000 mg | ORAL_TABLET | Freq: Every day | ORAL | 2 refills | Status: DC

## 2022-08-31 MED ORDER — FLECAINIDE ACETATE 100 MG PO TABS: 100.0000 mg | ORAL_TABLET | Freq: Every day | ORAL | 2 refills | Status: DC

## 2022-08-31 NOTE — Patient Instructions (Signed)
Medication Instructions:  Your physician recommends that you continue on your current medications as directed. Please refer to the Current Medication list given to you today.  *If you need a refill on your cardiac medications before your next appointment, please call your pharmacy*   Lab Work: None ordered   Testing/Procedures: None ordered   Follow-Up: At [facility], you and your health needs are our priority.  As part of our continuing mission to provide you with exceptional heart care, we have created designated Provider Care Teams.  These Care Teams include your primary Cardiologist (physician) and Advanced Practice Providers (APPs -  Physician Assistants and Nurse Practitioners) who all work together to provide you with the care you need, when you need it.  We recommend signing up for the patient portal called "MyChart".  Sign up information is provided on this After Visit Summary.  MyChart is used to connect with patients for Virtual Visits (Telemedicine).  Patients are able to view lab/test results, encounter notes, upcoming appointments, etc.  Non-urgent messages can be sent to your provider as well.   To learn more about what you can do with MyChart, go to [email or web address].    Your next appointment:   6 month(s)  The format for your next appointment:   In Person  Provider:   Loman Brooklyn, MD{  Thank you for choosing [facility]!!   Dory Horn, RN [phone]  Other Instructions  Check your blood pressure when you go pick up your medication refills.  If your blood pressure is greater than 130/80 call your primary care physician to discuss

## 2022-08-31 NOTE — Progress Notes (Signed)
  Electrophysiology Office Note:   Date:  08/31/2022  [name], DOB [birth year], MRN [id number]  Primary Cardiologist: Shyla Gayheart Jorja Loa, MD Electrophysiologist: Regan Lemming, MD      History of Present Illness:   Randall Smith is a 57 y.o. male with h/o atrial fibrillation seen today for routine electrophysiology followup.  Since last being seen in our clinic the patient reports doing well.  He has noted no further atrial fibrillation.  He does state that he was having some chest discomfort approximately a month ago.  He was taking quite a few supplements.  When he stopped the supplements his chest pain went away.  He does get short of breath at times, but he feels that his shortness of breath is normal for his level of fitness.  he denies chest pain, palpitations, dyspnea, PND, orthopnea, nausea, vomiting, dizziness, syncope, edema, weight gain, or early satiety.     Initially presented with palpitations and dizziness.  He was found to have atrial fibrillation in his primary physician's office with rates of 167.  He is now on both flecainide and metoprolol.     Review of systems complete and found to be negative unless listed in HPI.   Studies Reviewed:    EKG is ordered today. Personal review shows sinus rhythm   Risk Assessment/Calculations:    CHA2DS2-VASc Score = 1   This indicates a 0.6% annual risk of stroke. The patient's score is based upon: CHF History: 0 HTN History: 1 Diabetes History: 0 Stroke History: 0 Vascular Disease History: 0 Age Score: 0 Gender Score: 0     HYPERTENSION CONTROL Vitals:   08/31/22 0906 08/31/22 0929  BP: (!) 144/96 (!) 154/94    The patient's blood pressure is elevated above target today.  In order to address the patient's elevated BP: Blood pressure Kinda Pottle be monitored at home to determine if medication changes need to be made.; A current anti-hypertensive medication was adjusted today.; Follow up with primary care provider  for management.           Physical Exam:   VS:  BP (!) 154/94   Pulse 66   Ht 6\' 1"  (1.854 m)   Wt 253 lb (114.8 kg)   SpO2 96%   BMI 33.38 kg/m    Wt Readings from Last 3 Encounters:  08/31/22 253 lb (114.8 kg)  09/06/21 254 lb 6.4 oz (115.4 kg)  05/23/20 249 lb 12.8 oz (113.3 kg)     GEN: Well nourished, well developed in no acute distress NECK: No JVD; No carotid bruits CARDIAC: Regular rate and rhythm, no murmurs, rubs, gallops RESPIRATORY:  Clear to auscultation without rales, wheezing or rhonchi  ABDOMEN: Soft, non-tender, non-distended EXTREMITIES:  No edema; No deformity   ASSESSMENT AND PLAN:    1.  Persistent atrial fibrillation: Currently on flecainide and metoprolol.  Not anticoagulated due to low stroke risk.  Remains in sinus rhythm.  2.  Hypertension blood pressure elevated today.  3.  High risk medication monitoring: Currently on flecainide.  Stable intervals.  Follow up with EP APP in 6 months  Signed, Shavona Gunderman Jorja Loa, MD

## 2022-09-07 ENCOUNTER — Ambulatory Visit: Payer: 59 | Admitting: Student

## 2022-09-18 ENCOUNTER — Emergency Department (HOSPITAL_COMMUNITY)
Admission: EM | Admit: 2022-09-18 | Discharge: 2022-09-18 | Disposition: A | Discharge status: Home / Self Care | Payer: Worker's Compensation | Attending: Emergency Medicine | Admitting: Emergency Medicine

## 2022-09-18 ENCOUNTER — Encounter (HOSPITAL_COMMUNITY): Payer: Self-pay

## 2022-09-18 ENCOUNTER — Other Ambulatory Visit: Payer: Self-pay

## 2022-09-18 DIAGNOSIS — H2102 Hyphema, left eye: Secondary | ICD-10-CM | POA: Diagnosis not present

## 2022-09-18 DIAGNOSIS — S01112A Laceration without foreign body of left eyelid and periocular area, initial encounter: Secondary | ICD-10-CM | POA: Diagnosis not present

## 2022-09-18 DIAGNOSIS — Z79899 Other long term (current) drug therapy: Secondary | ICD-10-CM | POA: Diagnosis not present

## 2022-09-18 DIAGNOSIS — S0592XA Unspecified injury of left eye and orbit, initial encounter: Secondary | ICD-10-CM | POA: Diagnosis present

## 2022-09-18 DIAGNOSIS — Y990 Civilian activity done for income or pay: Secondary | ICD-10-CM | POA: Diagnosis not present

## 2022-09-18 DIAGNOSIS — W268XXA Contact with other sharp object(s), not elsewhere classified, initial encounter: Secondary | ICD-10-CM | POA: Diagnosis not present

## 2022-09-18 DIAGNOSIS — S058X2A Other injuries of left eye and orbit, initial encounter: Secondary | ICD-10-CM

## 2022-09-18 MED ORDER — FLUORESCEIN SODIUM 1 MG OP STRP
1.0000 | ORAL_STRIP | Freq: Once | OPHTHALMIC | Status: AC
  Administered 2022-09-18: 1 via OPHTHALMIC
  Filled 2022-09-18: qty 1

## 2022-09-18 MED ORDER — LIDOCAINE HCL (PF) 1 % IJ SOLN
5.0000 mL | Freq: Once | INTRAMUSCULAR | Status: AC
  Administered 2022-09-18: 5 mL
  Filled 2022-09-18: qty 5

## 2022-09-18 MED ORDER — TETRACAINE HCL 0.5 % OP SOLN
2.0000 [drp] | Freq: Once | OPHTHALMIC | Status: AC
  Administered 2022-09-18: 2 [drp] via OPHTHALMIC
  Filled 2022-09-18: qty 4

## 2022-09-18 NOTE — ED Triage Notes (Signed)
Pt BIB [facility] from work d/t a cap from a drain pipe that he was repairing flew into his Lt eye resulting in a lac on the Lt eye brow & blurry vision before bleeding was controlled on scene & Lt eye was covered with gauze drg. A/Ox4, reports 2/10 pain. Bruising & swelling noted around that eye & pt reports there was 200 psi behind the cap that hit him. Pt endorses that he was recently Dx with HTN but Is not on any meds for it currently. 178/122, 76 bpm, 98% RA, no PIV.

## 2022-09-18 NOTE — ED Provider Notes (Signed)
[place] [facility]PARTMENT AT [facility] Provider Note   CSN: [id number] Arrival date & time: 09/18/22  1302     History  Chief Complaint  Patient presents with   Lt Eye Injury   Lac Lt Eye Brow    Randall Smith is a 57 y.o. male.  HPI Patient presents with left eye injury.  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  States he had bright light and cannot see for a while but now patient is returning.  States he normally has a lazy right eye and his left eye is normally his good eye.  Laceration above the eye also.  Came for evaluation.    Home Medications Prior to Admission medications   Medication Sig Start Date End Date Taking? Authorizing Provider  flecainide (TAMBOCOR) 100 MG tablet Take 1 tablet (100 mg total) by mouth daily. Pt must keep upcoming appt in June 2023 with Cardiologist before anymore refills. Thank you Final Attempt 08/31/22   Regan Lemming, MD  metoprolol succinate (TOPROL-XL) 25 MG 24 hr tablet Take 1 tablet (25 mg total) by mouth daily. 08/31/22   Camnitz, Andree Coss, MD      Allergies    Patient has no known allergies.    Review of Systems   Review of Systems  Physical Exam Updated Vital Signs BP (!) 172/103   Pulse 60   Temp 98.3 F (36.8 C) (Oral)   Resp 19   SpO2 100%  Physical Exam Vitals reviewed.  HENT:     Head:     Comments: Abdomen ecchymosis and swelling of left upper and lower lid.  There is approximately 1 cm laceration on the upper lid.  Eye movements intact.  However does have hyphema on left eye.  Approximately 15 to 20%.  Does not involve visual axis.  Fluorescein exam does not show corneal injury.  Does on the left side of the I have subconjunctival hematoma.  Intraocular pressure of 17. Cardiovascular:     Rate and Rhythm: Regular rhythm.  Musculoskeletal:     Cervical back: Neck supple.  Neurological:     Mental Status: He is alert.     ED Results /  Procedures / Treatments   Labs (all labs ordered are listed, but only abnormal results are displayed) Labs Reviewed - No data to display  EKG None  Radiology No results found.  Procedures .Marland KitchenLaceration Repair  Date/Time: 09/18/2022 2:00 PM  Performed by: Benjiman Core, MD Authorized by: Benjiman Core, MD   Consent:    Consent obtained:  Verbal   Consent given by:  Patient   Risks discussed:  Infection, pain, retained foreign body, poor cosmetic result, need for additional repair and poor wound healing   Alternatives discussed:  No treatment and delayed treatment Universal protocol:    Patient identity confirmed:  Verbally with patient Anesthesia:    Anesthesia method:  Local infiltration   Local anesthetic:  Lidocaine 1% w/o epi Laceration details:    Location:  Face   Face location:  L upper eyelid   Extent:  Superficial   Length (cm):  1 Pre-procedure details:    Preparation:  Patient was prepped and draped in usual sterile fashion Exploration:    Limited defect created (wound extended): no     Hemostasis achieved with:  Direct pressure   Contaminated: no   Treatment:    Wound cleansed with:  Alcohol wipe.   Amount of cleaning:  Standard Skin repair:    Repair method:  Sutures   Suture size:  6-0   Suture material:  Prolene   Suture technique:  Simple interrupted   Number of sutures:  5 Approximation:    Approximation:  Close Repair type:    Repair type:  Simple Post-procedure details:    Dressing:  Open (no dressing)   Procedure completion:  Tolerated well, no immediate complications     Medications Ordered in ED Medications  tetracaine (PONTOCAINE) 0.5 % ophthalmic solution 2 drop (2 drops Left Eye Given by Other 09/18/22 1334)  fluorescein ophthalmic strip 1 strip (1 strip Left Eye Given by Other 09/18/22 1334)  lidocaine (PF) (XYLOCAINE) 1 % injection 5 mL (5 mLs Infiltration Given by Other 09/18/22 1406)    ED Course/ Medical Decision Making/  A&P                             Medical Decision Making Risk Prescription drug management.   Patient has left eye injury.  Does have visual hyphema.  Decreased visual acuity but has been improving.  Laceration of the lid closed.  Orbital floor fracture considered but overall not impinging.  Discussed with Dr. Essie Hart from ophthalmology.  In order to be able to get him seen in the office by 5:00 will have wound closed in the ER and then follow-up for better exam there.  They can finish exam after that if further treatment is needed.        Final Clinical Impression(s) / ED Diagnoses Final diagnoses:  Left eyelid laceration, initial encounter  Hyphema of left eye  Blunt trauma of left eye, initial encounter    Rx / DC Orders ED Discharge Orders     None         Benjiman Core, MD 09/18/22 1539

## 2022-09-18 NOTE — Discharge Instructions (Signed)
Go to the Ophthalmology office

## 2022-09-18 NOTE — ED Notes (Signed)
Tonopen at bedside.  

## 2023-03-03 NOTE — Progress Notes (Unsigned)
Electrophysiology Office Note:   Date:  03/05/2023  ID:  LOI ALWORTH, DOB 1965/11/25, MRN [id number]  Primary Cardiologist: Byard Carranza Jorja Loa, MD Primary Heart Failure: None Electrophysiologist: Crayton Savarese Jorja Loa, MD      History of Present Illness:   Randall Smith is a 57 y.o. male with h/o atrial fibrillation seen today for routine electrophysiology followup.   Since last being seen in our clinic the patient reports doing overall well.  He has noted no further episodes of atrial fibrillation.  His blood pressure is elevated today, but he is unaware of high blood pressure in the past.  He states that his blood pressure is elevated each time he comes to clinic.  He also has had an episode of chest pain.  Chest pain occurred while he was driving.  He had some mild shortness of breath during the episode.  This is the first time an episode like this has occurred.  he denies palpitations, PND, orthopnea, nausea, vomiting, dizziness, syncope, edema, weight gain, or early satiety.   Review of systems complete and found to be negative unless listed in HPI.   EP Information / Studies Reviewed:    EKG is ordered today. Personal review as below.  EKG Interpretation Date/Time:  Tuesday March 05 2023 09:19:12 EST Ventricular Rate:  67 PR Interval:  146 QRS Duration:  90 QT Interval:  420 QTC Calculation: 443 R Axis:   75  Text Interpretation: Normal sinus rhythm Normal ECG When compared with ECG of 31-Aug-2022 09:12, No significant change was found Confirmed by Chelbie Jarnagin ([id number]) on 03/05/2023 9:40:56 AM     Risk Assessment/Calculations:    CHA2DS2-VASc Score = 1   This indicates a 0.6% annual risk of stroke. The patient's score is based upon: CHF History: 0 HTN History: 1 Diabetes History: 0 Stroke History: 0 Vascular Disease History: 0 Age Score: 0 Gender Score: 0          Physical Exam:   VS:  BP (!) 160/112 (BP Location: Left Arm, Patient Position: Sitting, Cuff  Size: Large)   Pulse 67   Ht 6\' 1"  (1.854 m)   Wt 266 lb (120.7 kg)   SpO2 96%   BMI 35.09 kg/m    Wt Readings from Last 3 Encounters:  03/05/23 266 lb (120.7 kg)  08/31/22 253 lb (114.8 kg)  09/06/21 254 lb 6.4 oz (115.4 kg)     GEN: Well nourished, well developed in no acute distress NECK: No JVD; No carotid bruits CARDIAC: Regular rate and rhythm, no murmurs, rubs, gallops RESPIRATORY:  Clear to auscultation without rales, wheezing or rhonchi  ABDOMEN: Soft, non-tender, non-distended EXTREMITIES:  No edema; No deformity   ASSESSMENT AND PLAN:    1.  Persistent atrial fibrillation: Currently on flecainide and metoprolol.  Not anticoagulated due to low stroke risk.  Feeling well without further episodes of atrial fibrillation.  2.  Hypertension: Blood pressure elevated today in clinic.  He states that he is unaware of high blood pressures.  He Kerrigan Glendening get a blood pressure cuff and check it at home.  He Bence Trapp let us know in 2 weeks what his blood pressures have been.  3.  Chest pain: Due to his current risk factors of obesity and hypertension, he would be at high risk of coronary artery disease.  Pain sounds somewhat atypical, though I do think that evaluation would be reasonable.  Kalayah Leske plan for coronary CTA.  Follow up with EP APP in 6 months  Signed,  Dondi Aime Jorja Loa, MD

## 2023-03-05 ENCOUNTER — Encounter: Payer: Self-pay | Admitting: Cardiology

## 2023-03-05 ENCOUNTER — Ambulatory Visit: Payer: 59 | Attending: Cardiology | Admitting: Cardiology

## 2023-03-05 VITALS — BP 160/112 | HR 67 | Ht 73.0 in | Wt 266.0 lb

## 2023-03-05 DIAGNOSIS — R079 Chest pain, unspecified: Secondary | ICD-10-CM

## 2023-03-05 DIAGNOSIS — Z79899 Other long term (current) drug therapy: Secondary | ICD-10-CM

## 2023-03-05 DIAGNOSIS — I480 Paroxysmal atrial fibrillation: Secondary | ICD-10-CM | POA: Diagnosis not present

## 2023-03-05 DIAGNOSIS — Z5181 Encounter for therapeutic drug level monitoring: Secondary | ICD-10-CM | POA: Diagnosis not present

## 2023-03-05 DIAGNOSIS — R0789 Other chest pain: Secondary | ICD-10-CM

## 2023-03-05 NOTE — Patient Instructions (Addendum)
Medication Instructions:  Your physician recommends that you continue on your current medications as directed. Please refer to the Current Medication list given to you today.  *If you need a refill on your cardiac medications before your next appointment, please call your pharmacy*   Lab Work: None ordered   Testing/Procedures: Your physician has requested that you have cardiac CT. Cardiac computed tomography (CT) is a painless test that uses an x-ray machine to take clear, detailed pictures of your heart. For further information please visit [email or web address] Please follow instruction sheet as given.    Follow-Up: At [facility], you and your health needs are our priority.  As part of our continuing mission to provide you with exceptional heart care, we have created designated Provider Care Teams.  These Care Teams include your primary Cardiologist (physician) and Advanced Practice Providers (APPs -  Physician Assistants and Nurse Practitioners) who all work together to provide you with the care you need, when you need it.  You have been referred to medical weight management.  Your next appointment:   6 month(s)  The format for your next appointment:   In Person  Provider:   You will see one of the following Advanced Practice Providers on your designated Care Team:   Francis Dowse, Charlott Holler [address]" [place], [place] Sherie Don, NP Canary Brim, NP    Thank you for choosing [facility]!!   Dory Horn, RN [phone]  Other Instructions  Obtain a blood pressure curr. Please keep track of your blood pressures at home and call us in several weeks with those readings.  You may also send the information via mychart.

## 2023-03-22 ENCOUNTER — Encounter (HOSPITAL_COMMUNITY): Payer: Self-pay

## 2023-03-26 ENCOUNTER — Ambulatory Visit (HOSPITAL_COMMUNITY)
Admission: RE | Admit: 2023-03-26 | Discharge: 2023-03-26 | Disposition: A | Discharge status: Home / Self Care | Payer: 59 | Source: Ambulatory Visit | Attending: Cardiology | Admitting: Cardiology

## 2023-03-26 DIAGNOSIS — R0789 Other chest pain: Secondary | ICD-10-CM | POA: Insufficient documentation

## 2023-03-26 MED ORDER — NITROGLYCERIN 0.4 MG SL SUBL
0.8000 mg | SUBLINGUAL_TABLET | Freq: Once | SUBLINGUAL | Status: AC
  Administered 2023-03-26: 0.8 mg via SUBLINGUAL

## 2023-03-26 MED ORDER — IOHEXOL 350 MG/ML SOLN
95.0000 mL | Freq: Once | INTRAVENOUS | Status: AC | PRN
  Administered 2023-03-26: 95 mL via INTRAVENOUS

## 2023-03-26 MED ORDER — NITROGLYCERIN 0.4 MG SL SUBL
SUBLINGUAL_TABLET | SUBLINGUAL | Status: AC
  Filled 2023-03-26: qty 2

## 2023-04-04 ENCOUNTER — Other Ambulatory Visit: Payer: Self-pay | Admitting: *Deleted

## 2023-04-04 DIAGNOSIS — R931 Abnormal findings on diagnostic imaging of heart and coronary circulation: Secondary | ICD-10-CM

## 2023-04-18 DIAGNOSIS — Z0289 Encounter for other administrative examinations: Secondary | ICD-10-CM

## 2023-04-22 ENCOUNTER — Ambulatory Visit (INDEPENDENT_AMBULATORY_CARE_PROVIDER_SITE_OTHER): Payer: 59 | Admitting: Family Medicine

## 2023-04-22 ENCOUNTER — Encounter (INDEPENDENT_AMBULATORY_CARE_PROVIDER_SITE_OTHER): Payer: Self-pay | Admitting: Family Medicine

## 2023-04-22 VITALS — BP 174/92 | HR 65 | Temp 98.1°F | Ht 71.0 in | Wt 255.0 lb

## 2023-04-22 DIAGNOSIS — I482 Chronic atrial fibrillation, unspecified: Secondary | ICD-10-CM | POA: Diagnosis not present

## 2023-04-22 DIAGNOSIS — E669 Obesity, unspecified: Secondary | ICD-10-CM | POA: Insufficient documentation

## 2023-04-22 DIAGNOSIS — Z6835 Body mass index (BMI) 35.0-35.9, adult: Secondary | ICD-10-CM | POA: Insufficient documentation

## 2023-04-22 DIAGNOSIS — I10 Essential (primary) hypertension: Secondary | ICD-10-CM | POA: Insufficient documentation

## 2023-04-22 DIAGNOSIS — R739 Hyperglycemia, unspecified: Secondary | ICD-10-CM

## 2023-04-22 HISTORY — DX: Obesity, unspecified: E66.9

## 2023-04-22 HISTORY — DX: Hyperglycemia, unspecified: R73.9

## 2023-04-22 HISTORY — DX: Body mass index (BMI) 35.0-35.9, adult: Z68.35

## 2023-04-22 NOTE — Progress Notes (Signed)
 .smr  Office: [phone]  /  Fax: [phone]  WEIGHT SUMMARY AND BIOMETRICS  Anthropometric Measurements Height: 5\' 11"  (1.803 m) Weight: 255 lb (115.7 kg) BMI (Calculated): 35.58 Weight at Last Visit: N/A Weight Lost Since Last Visit: N/A Weight Gained Since Last Visit: N/A Starting Weight: N/A Peak Weight: N/A   Body Composition  Body Fat %: 35.2 % Fat Mass (lbs): 90 lbs Muscle Mass (lbs): 157.4 lbs Total Body Water (lbs): 117 lbs Visceral Fat Rating : 20   Other Clinical Data Fasting: No Labs: No Today's Visit #: Information Session    Chief Complaint: OBESITY    History of Present Illness   Randall Smith is a 58 year old male with obesity and atrial fibrillation who presents to discuss obesity management and related comorbidities. He was referred by Dr. Missy Amos for weight management.  He has struggled with obesity for a long time, which he began to address seriously after being diagnosed with atrial fibrillation. 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 His activity level has decreased over time, making it difficult to manage work duties, particularly during the summer. He previously lost weight using the [employer] program by avoiding fast food, fried foods, and sodas, reaching a weight of 180 pounds, but found it challenging to maintain.  He has a history of elevated blood pressure readings over the past six months, with today's readings at 163/82 mmHg and 174/92 mmHg. He is concerned about his blood pressure increasing as he ages.  He follows with cardiology for atrial fibrillation, which is currently well-controlled. He is on Toprol  and flecainide  for this condition.  He has a history of hyperglycemia, although no recent labs have been conducted.  He works as a Network engineer, which involves significant physical  activity, including climbing stairs and crawling. He is actively involved in his stepdaughter's life, supporting her in sports activities. He has a personal goal to maintain his health for his 41 year old stepdaughter.          PHYSICAL EXAM:  Blood pressure (!) 174/92, pulse 65, temperature 98.1 F (36.7 C), height 5\' 11"  (1.803 m), weight 255 lb (115.7 kg), SpO2 97%. Body mass index is 35.57 kg/m.  DIAGNOSTIC DATA REVIEWED:  BMET    Component Value Date/Time   NA 139 05/23/2020 0935   K 4.1 05/23/2020 0935   CL 101 05/23/2020 0935   CO2 22 05/23/2020 0935   GLUCOSE 104 (H) 05/23/2020 0935   GLUCOSE 137 (H) 12/08/2011 0910   BUN 18 05/23/2020 0935   CREATININE 1.11 05/23/2020 0935   CALCIUM  9.6 05/23/2020 0935   GFRNONAA 68 (L) 12/08/2011 0910   GFRAA 79 (L) 12/08/2011 0910   No results found for: "HGBA1C" No results found for: "INSULIN " No results found for: "TSH" CBC    Component Value Date/Time   WBC 11.6 (H) 12/08/2011 0910   RBC 5.01 12/08/2011 0910   HGB 15.9 12/08/2011 0910   HCT 44.1 12/08/2011 0910   PLT 230 12/08/2011 0910   MCV 88.0 12/08/2011 0910   MCH 31.7 12/08/2011 0910   MCHC 36.1 (H) 12/08/2011 0910   RDW 12.6 12/08/2011 0910   Iron Studies No results found for: "IRON", "TIBC", "FERRITIN", "IRONPCTSAT" Lipid Panel  No results found for: "CHOL", "TRIG", "HDL", "CHOLHDL", "VLDL", "LDLCALC", "LDLDIRECT" Hepatic Function Panel  No results found for: "PROT", "ALBUMIN", "AST", "ALT", "ALKPHOS", "BILITOT", "BILIDIR", "  IBILI" No results found for: "TSH" Nutritional No results found for: "VD25OH"   Assessment and Plan    Obesity Obesity with a BMI of 35.6, visceral fat rating of 20, and body fat percentage of 35.2. He is motivated to lose weight to improve overall health and care for his daughter. Discussed genetic factors influencing weight loss resistance and the benefits of weight loss on comorbid conditions such as hypertension and atrial  fibrillation. Emphasized personalized nutrition and exercise plans, and potential use of weight loss medications. - Schedule comprehensive workup visit - Provide new patient paperwork focusing on health habits and patterns - Instruct fasting for 8 hours before next visit, avoiding caffeine, nicotine, and exercise - Discuss potential use of weight loss medications if necessary - Provide nutritional counseling and develop personalized eating plan - Plan bi-weekly visits for the first six sessions, then extend intervals as appropriate  Hypertension Hypertension with multiple elevated readings (163/82 mmHg, 174/92 mmHg) over the past six months. Discussed that weight loss can significantly reduce blood pressure and improve cardiovascular health. - Monitor blood pressure regularly - Discuss benefits of weight loss on blood pressure control -Start workup to treat obesity and will follow  Atrial Fibrillation Atrial fibrillation, well-controlled with Toprol  and Flecainide . Follows with cardiology. Discussed that weight loss can reduce AFib flare-ups, supported by studies. - Continue Toprol  and Flecainide  - Discuss benefits of weight loss on reducing AFib flare-ups  Hyperglycemia Hyperglycemia, no recent labs to assess current status. Discussed importance of monitoring glycemic control as part of weight loss program. - Order labs to assess current glycemic control  -start appropriate eating plan after testing to work on weight loss and tight glycemic control  General Health Maintenance Emphasized maintaining overall health to support weight loss and manage comorbidities. Discussed balanced nutrition and regular physical activity tailored to avoid exacerbating knee pain. - Encourage regular physical activity tailored to avoid exacerbating knee pain - Advise on balanced nutrition and avoiding extreme dietary restrictions - Discuss importance of maintaining a healthy lifestyle for long-term health  benefits  Follow-up - Schedule follow-up visits every two weeks for the first six sessions - Extend follow-up intervals to three to four weeks as appropriate - Continue follow-up every three to four months after reaching weight loss goals.         I have personally spent 35 minutes total time today in preparation, patient care, and documentation for this visit, including the following: review of clinical lab tests; review of medical tests/procedures/services.    He was informed of the importance of frequent follow up visits to maximize his success with intensive lifestyle modifications for his multiple health conditions.    Jasmine Mesi, MD

## 2023-05-23 ENCOUNTER — Ambulatory Visit (INDEPENDENT_AMBULATORY_CARE_PROVIDER_SITE_OTHER): Payer: 59 | Admitting: Family Medicine

## 2023-05-23 ENCOUNTER — Encounter (INDEPENDENT_AMBULATORY_CARE_PROVIDER_SITE_OTHER): Payer: Self-pay | Admitting: Family Medicine

## 2023-05-23 VITALS — BP 178/108 | HR 64 | Temp 97.9°F | Ht 71.0 in | Wt 253.0 lb

## 2023-05-23 DIAGNOSIS — E669 Obesity, unspecified: Secondary | ICD-10-CM | POA: Diagnosis not present

## 2023-05-23 DIAGNOSIS — I482 Chronic atrial fibrillation, unspecified: Secondary | ICD-10-CM

## 2023-05-23 DIAGNOSIS — R0602 Shortness of breath: Secondary | ICD-10-CM

## 2023-05-23 DIAGNOSIS — Z6835 Body mass index (BMI) 35.0-35.9, adult: Secondary | ICD-10-CM

## 2023-05-23 DIAGNOSIS — I10 Essential (primary) hypertension: Secondary | ICD-10-CM

## 2023-05-23 DIAGNOSIS — Z1331 Encounter for screening for depression: Secondary | ICD-10-CM

## 2023-05-23 DIAGNOSIS — R5383 Other fatigue: Secondary | ICD-10-CM

## 2023-05-23 NOTE — Progress Notes (Addendum)
 Randall Smith, D.O.  ABFM, ABOM Specializing in Clinical Bariatric Medicine Office located at: [address]  [place], [place]  [address]    Bariatric Medicine Visit  Dear Randall Smith, Randall Koh, MD   Thank you for referring Randall Smith to our clinic today for evaluation.  We performed a consultation to discuss his options for treatment and educate the patient on his disease state.  The following note includes my evaluation and treatment recommendations.   Please do not hesitate to reach out to me directly if you have any further concerns.   Assessment and Plan:   FOR THE DISEASE OF OBESITY:  BMI 35.0-35.9,adult Obesity, Beginning BMI 35.6 Assessment & Plan: Randall Smith is currently in the action stage of change. As such, his goal is to start our weight management plan.  He has agreed to: Category 3 MP with B/L choices and only 100 snack calories   Behavioral Intervention We discussed the following today: begin to work on maintaining a reduced calorie state, getting the recommended amount of protein, incorporating whole foods, making healthy choices, staying well hydrated and practicing mindfulness when eating.  Additional resources provided today: handout on CAT 3 meal plan, Handout on CAT 3-4 breakfast options, and Handout on CAT 3-4 lunch options  Evidence-based interventions for health behavior change were utilized today including the discussion of self monitoring techniques, problem-solving barriers and SMART goal setting techniques.    Pt will specifically work on: n/a   Recommended Physical Activity Goals Randall Smith has been advised to work up to 150 minutes of moderate intensity aerobic activity a week and strengthening exercises 2-3 times per week for cardiovascular health, weight loss maintenance and preservation of muscle mass.   He has agreed to : maintain current level of activity.    Pharmacotherapy We both agreed to : begin with nutritional and behavioral  strategies   FOR ASSOCIATED CONDITIONS ADDRESSED TODAY:   Fatigue Assessment & Plan: Randall Smith does feel that his weight is causing his energy to be lower than it should be. Fatigue may be related to obesity, depression or many other causes. he does not appear to have any red flag symptoms and this appears to most likely be related to his current lifestyle habits and dietary intake.  Labs will be ordered and reviewed with him at their next office visit in two weeks.  Epworth sleepiness scale is 13 and appears to not be within normal limits. Randall Smith admits to some daytime somnolence and admits to waking up still tired.  Randall Smith generally gets 8 hours of sleep per night, and states that he has generally restful sleep. Snoring is present. Apneic episodes are not present.   Pt states he had a sleep study 10-15 years ago and was not told he needed a machine at that time. Discussed that his ESS is elevated which could mean that he is at higher risk for developing sleep apnea. F/up with PCP to discuss further care.   ECG: Performed with cardiology on 03/05/2023 and reviewed/ interpreted independently.  Normal sinus rhythm, rate 67 bpm; reassuring without any acute abnormalities, will continue to monitor for symptoms   Relevant Orders:  -     CBC with Differential/Platelet -     Comprehensive metabolic panel -     Hemoglobin A1c -     Lipid panel -     T4, free -     TSH -     Vitamin B12 -     VITAMIN D 25  Hydroxy (Vit-D Deficiency, Fractures) -     Insulin, random -     Folate   Shortness of breath on exertion Assessment & Plan: Randall Smith does feel that he gets out of breath more easily than he used to when he exercises and seems to be worsening over time with weight gain.  This has gotten worse recently. Randall Smith denies shortness of breath at rest or orthopnea. Randall Smith shortness of breath appears to be obesity related and exercise induced, as they do not appear to have any "red flag" symptoms/ concerns today.   Also, this condition appears to be related to a state of poor cardiovascular conditioning   Obtain labs today and will be reviewed with him at their next office visit in two weeks.  Indirect Calorimeter completed today to help guide our dietary regimen. It shows a VO2 of 318 and a REE of 2189.  His calculated basal metabolic rate is 5009 thus his resting energy expenditure is worse than expected.  Patient agreed to work on weight loss at this time.  As Randall Smith progresses through our weight loss program, we will gradually increase exercise as tolerated to treat his current condition.   If Randall Smith follows our recommendations and loses 5-10% of their weight without improvement of his shortness of breath or if at any time, symptoms become more concerning, they agree to urgently follow up with their PCP/ specialist for further consideration/ evaluation.   Randall Smith verbalizes agreement with this plan.    Depression Screen  Assessment & Plan: His Food and Mood (modified PHQ-9) score was positive at 15. Denies SI/HI. Mood is well controlled. 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 He has developed strategies to reduce this habit. Overall, pt does not feel that emotional eating is a barrier for him. Pt informed that we have a bariatric psychologist that he can be referred to in the future if needed. Will continue to monitor for any non-hunger eating patterns.   Chronic atrial fibrillation (HCC) Assessment & Plan: Diagnosed 8 years ago per pt. Managed by EP Randall Smith. Currently on flecainide and metoprolol. Had recent episode of chest pain that was discussed with Randall Smith on 03/05/2023. Work up was done which included a Coronary CT scan; scan showed coronary calcium score of 0, total plaque volume that was 47th percentile for age and sex, and a 5 mm right lower lobe pulmonary nodule was incidentally found. States he has had no epidoses of chest pain  since 03/05/2023.  Pt was referred to general cardiology by Randall Smith, the EP Cardiologist, for further discussion of plaque seen on CT and for management of his blood pressure -pt reminded to make appointment with them . Pt will also be following up with PCP regarding pulmonary nodule finding. Continue w/ all medications. Avoid caffeine/energy drinks. Start heart-healthy meal plan.   Primary hypertension Assessment & Plan: Last 3 blood pressure readings in our office are as follows: BP Readings from Last 3 Encounters:  05/23/23 (!) 178/108  04/22/23 (!) 174/92  03/26/23 (!) 142/95   Pt is aware his metoprolol will have some affect on blood pressure and that his BP is poorly controlled. Pt asymptomatic today. Discussed goal BP: 120/80 or less.  Pt will be establishing with a general cardiologist in the very near future. Check BP and heart rate at home every day and provide values to cardiologist. Start low salt diet.    FOLLOW UP:   Follow up  in 2 weeks. He was informed of the importance of frequent follow up visits to maximize his success with intensive lifestyle modifications for his multiple health conditions.  Randall Smith is aware that we will review all of his lab results at our next visit.  He is aware that if anything is critical/ life threatening with the results, we will be contacting him via MyChart prior to the office visit to discuss management.    Chief Complaint:   OBESITY Randall Smith (MR# [id number]) is a pleasant 58 y.o. male who presents for evaluation and treatment of obesity and related comorbidities. Current BMI is Body mass index is 35.29 kg/m. Randall Smith has been struggling with his weight for many years and has been unsuccessful in either losing weight, maintaining weight loss, or reaching his healthy weight goal.  Randall Smith is currently in the action stage of change and ready to dedicate time achieving and maintaining a healthier weight. Randall Smith is  interested in becoming our patient and working on intensive lifestyle modifications including (but not limited to) diet and exercise for weight loss.  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  Patient is separated and has 2 adult children - currently he is actively involved in taking care of his ex-wife's 76 y.o daughter. He lives with a room mate.   Heaviest weight: 266 lbs.   Desires to be 205 lbs within a year. Wants to lose wt to "feel better & ultimately live to see kids mature more and meet milestones"  Lost 70 lbs on the [employer] weight loss plan, later regained wt.   Eats outside the home 2-3 times per week.   Enjoys cooking  Does not have any particular cravings.   Food dislikes: liver & eggplant.   Skips breakfast daily  Drinks sweet tea   Worst food habit: "overeating and dipping sauces/seasonings"  Subjective:   This is the patient's first visit at [facility].  The patient's NEW PATIENT PACKET that they filled out prior to today's office visit was reviewed at length and information from that paperwork was included within the following office visit note.    Included in the packet: current and past health history, medications, allergies, ROS, gynecologic history (women only), surgical history, family history, social history, weight history, weight loss surgery history (for those that have had weight loss surgery), nutritional evaluation, mood and food questionnaire along with a depression screening (PHQ9) on all patients, an Epworth questionnaire, sleep habits questionnaire, patient life and health improvement goals questionnaire. These will all be scanned into the patient's chart under the "media" tab.   Review of Systems: Please refer to new patient packet scanned into media. Pertinent positives were addressed with patient today.  Reviewed by clinician on day of visit: allergies, medications, problem list, medical  history, surgical history, family history, social history, and previous encounter notes.  During the visit, I independently reviewed the patient's EKG, bioimpedance scale results, and indirect calorimeter results. I used this information to tailor a meal plan for the patient that will help Randall Smith to lose weight and will improve his obesity-related conditions going forward.  I performed a medically necessary appropriate examination and/or evaluation. I discussed the assessment and treatment plan with the patient. The patient was provided an opportunity to ask questions and all were answered. The patient agreed with the plan and demonstrated an understanding of the instructions. Labs were ordered today (unless  patient declined them) and will be reviewed with the patient at our next visit unless more critical results need to be addressed immediately. Clinical information was updated and documented in the EMR.   Objective:   PHYSICAL EXAM: Blood pressure (!) 178/108, pulse 64, temperature 97.9 F (36.6 C), height 5\' 11"  (1.803 m), weight 253 lb (114.8 kg), SpO2 99%. Body mass index is 35.29 kg/m.  General: Well Developed, well nourished, and in no acute distress.  HEENT: Normocephalic, atraumatic; EOMI, sclerae are anicteric. Skin: Warm and dry, good turgor Chest:  Normal excursion, shape, no gross ABN Respiratory: No conversational dyspnea; speaking in full sentences NeuroM-Sk:  Normal gross ROM * 4 extremities  Psych: A and O *3, insight adequate, mood- full   Anthropometric Measurements Height: 5\' 11"  (1.803 m) Weight: 253 lb (114.8 kg) BMI (Calculated): 35.3 Weight at Last Visit: N/A Weight Lost Since Last Visit: N/A Weight Gained Since Last Visit: N/A Starting Weight: 253 lb Peak Weight: 266 lb   Body Composition  Body Fat %: 35.2 % Fat Mass (lbs): 89.4 lbs Muscle Mass (lbs): 156.4 lbs Total Body Water (lbs): 115.2 lbs Visceral Fat Rating : 20   Other Clinical Data RMR:  [id number] Fasting: Yes Labs: Yes Today's Visit #: #1 Starting Date: 05/23/23 Comments: First Visit   DIAGNOSTIC DATA REVIEWED:  BMET    Component Value Date/Time   NA 139 05/23/2020 0935   K 4.1 05/23/2020 0935   CL 101 05/23/2020 0935   CO2 22 05/23/2020 0935   GLUCOSE 104 (H) 05/23/2020 0935   GLUCOSE 137 (H) 12/08/2011 0910   BUN 18 05/23/2020 0935   CREATININE 1.11 05/23/2020 0935   CALCIUM 9.6 05/23/2020 0935   GFRNONAA 68 (L) 12/08/2011 0910   GFRAA 79 (L) 12/08/2011 0910   No results found for: "HGBA1C" No results found for: "INSULIN" No results found for: "TSH" CBC    Component Value Date/Time   WBC 11.6 (H) 12/08/2011 0910   RBC 5.01 12/08/2011 0910   HGB 15.9 12/08/2011 0910   HCT 44.1 12/08/2011 0910   PLT 230 12/08/2011 0910   MCV 88.0 12/08/2011 0910   MCH 31.7 12/08/2011 0910   MCHC 36.1 (H) 12/08/2011 0910   RDW 12.6 12/08/2011 0910   Iron Studies No results found for: "IRON", "TIBC", "FERRITIN", "IRONPCTSAT" Lipid Panel  No results found for: "CHOL", "TRIG", "HDL", "CHOLHDL", "VLDL", "LDLCALC", "LDLDIRECT" Hepatic Function Panel  No results found for: "PROT", "ALBUMIN", "AST", "ALT", "ALKPHOS", "BILITOT", "BILIDIR", "IBILI" No results found for: "TSH" Nutritional No results found for: "VD25OH"  Attestation Statements:   I, Special Puri, acting as a Stage manager for [employer], DO., have compiled all relevant documentation for today's office visit on behalf of Thomasene Lot, DO, while in the presence of [employer], DO.  Reviewed by clinician on day of visit: allergies, medications, problem list, medical history, surgical history, family history, social history, and previous encounter notes pertinent to patient's obesity diagnosis. I have spent 70 minutes in the care of the patient today including: preparing to see patient (e.g. review and interpretation of tests, old notes ), obtaining and/or reviewing separately obtained history,  performing a medically appropriate examination or evaluation, counseling and educating the patient, ordering medications, test or procedures, documenting clinical information in the electronic or other health care record, and independently interpreting results and communicating results to the patient, family, or caregiver   I have reviewed the above documentation for accuracy and completeness, and I agree with the above. Gavin Pound  Dalbert Batman, D.O.  The 21st Century Cures Act was signed into law in 2016 which includes the topic of electronic health records.  This provides immediate access to information in MyChart.  This includes consultation notes, operative notes, office notes, lab results and pathology reports.  If you have any questions about what you read please let us know at your next visit so we can discuss your concerns and take corrective action if need be.  We are right here with you.

## 2023-05-27 ENCOUNTER — Encounter: Payer: Self-pay | Admitting: Cardiology

## 2023-05-27 ENCOUNTER — Ambulatory Visit: Attending: Cardiology | Admitting: Cardiology

## 2023-05-27 VITALS — BP 166/100 | HR 73 | Ht 73.0 in | Wt 261.8 lb

## 2023-05-27 DIAGNOSIS — I480 Paroxysmal atrial fibrillation: Secondary | ICD-10-CM

## 2023-05-27 DIAGNOSIS — I10 Essential (primary) hypertension: Secondary | ICD-10-CM | POA: Diagnosis not present

## 2023-05-27 DIAGNOSIS — I2510 Atherosclerotic heart disease of native coronary artery without angina pectoris: Secondary | ICD-10-CM | POA: Insufficient documentation

## 2023-05-27 DIAGNOSIS — E785 Hyperlipidemia, unspecified: Secondary | ICD-10-CM

## 2023-05-27 HISTORY — DX: Atherosclerotic heart disease of native coronary artery without angina pectoris: I25.10

## 2023-05-27 HISTORY — DX: Hyperlipidemia, unspecified: E78.5

## 2023-05-27 MED ORDER — LOSARTAN POTASSIUM 50 MG PO TABS: 50.0000 mg | ORAL_TABLET | Freq: Every day | ORAL | 3 refills | Status: DC

## 2023-05-27 MED ORDER — ROSUVASTATIN CALCIUM 10 MG PO TABS: 10.0000 mg | ORAL_TABLET | Freq: Every day | ORAL | 3 refills | Status: AC

## 2023-05-27 MED ORDER — ASPIRIN 81 MG PO TBEC: 81.0000 mg | DELAYED_RELEASE_TABLET | Freq: Every day | ORAL | Status: AC

## 2023-05-27 NOTE — Patient Instructions (Addendum)
 Medication Instructions:   START: Aspirin 81mg  1 tablet daily  START: Losartan 50mg  1 tablet daily  START: Crestor 10mg  1 tablet daily   Lab Work: Your physician recommends that you return for lab work in: 1 week You need to have labs done when you are fasting.  You can come Monday through Friday 8:30 am to 12:00 pm and 1:15 to 4:30. You do not need to make an appointment as the order has already been placed. The labs you are going to have done are BMET   Testing/Procedures: None Ordered   Follow-Up: At [facility], you and your health needs are our priority.  As part of our continuing mission to provide you with exceptional heart care, we have created designated Provider Care Teams.  These Care Teams include your primary Cardiologist (physician) and Advanced Practice Providers (APPs -  Physician Assistants and Nurse Practitioners) who all work together to provide you with the care you need, when you need it.  We recommend signing up for the patient portal called "MyChart".  Sign up information is provided on this After Visit Summary.  MyChart is used to connect with patients for Virtual Visits (Telemedicine).  Patients are able to view lab/test results, encounter notes, upcoming appointments, etc.  Non-urgent messages can be sent to your provider as well.   To learn more about what you can do with MyChart, go to [email or web address].    Your next appointment:   6 week(s)  The format for your next appointment:   In Person  Provider:   Gypsy Balsam, MD    Other Instructions NA

## 2023-05-27 NOTE — Progress Notes (Signed)
 Cardiology Consultation:    Date:  05/27/2023   [name], DOB 12-20-1965, MRN [id number]  PCP:  Randall Garbe, MD  Cardiologist:  Randall Balsam, MD   Referring MD: Randall Garbe, MD   Chief Complaint  Patient presents with   Hypertension    History of Present Illness:    Randall Smith is a 58 y.o. male who is being seen today for the evaluation of coronary disease, hypertension at the request of Randall Smith, Randall Koh, MD. past medical history significant for paroxysmal atrial fibrillation with being suffering for many years he tells me he got multiple episodes of atrial fibrillation until recently's been put on flecainide and seems to be doing well since that time.  He is not anticoagulated because his CHADS2 Vascor is only 1.  Recently he started complaining of having some atypical chest pain, coronary CT angio has been done which showed elevated calcium score and mild obstructive disease between 0 and 25%.  He comes to talk about this.  He denies having any recent chest pain tightness squeezing pressure burning chest.  He goes to weight management clinic working on his weight loss.  Denies having the recent palpitations there is no shortness of breath tightness squeezing pressure burning chest.  Recently however he did notice his blood pressure going high.  He does not smoke never did does not have family history of heart issue but not premature.  Past Medical History:  Diagnosis Date   A-fib (HCC)    Back pain    Chest pain    Chest pain    Dizzy spells    Elevated glucose    GERD (gastroesophageal reflux disease)    GERD (gastroesophageal reflux disease)    High blood pressure    Joint pain    Kidney stone    Low HDL (under 40)    Lung nodule    Overweight    Palpitations    Palpitations    Racing heart beat    SOB (shortness of breath)    Swelling of lower extremity     Past Surgical History:  Procedure Laterality Date   KNEE SURGERY Left 1996     Current Medications: Current Meds  Medication Sig   flecainide (TAMBOCOR) 100 MG tablet Take 1 tablet (100 mg total) by mouth daily. Pt must keep upcoming appt in June 2023 with Cardiologist before anymore refills. Thank you Final Attempt   metoprolol succinate (TOPROL-XL) 25 MG 24 hr tablet Take 1 tablet (25 mg total) by mouth daily.     Allergies:   Patient has no known allergies.   Social History   Socioeconomic History   Marital status: Married    Spouse name: Not on file   Number of children: Not on file   Years of education: Not on file   Highest education level: Not on file  Occupational History   Not on file  Tobacco Use   Smoking status: Never   Smokeless tobacco: Never  Substance and Sexual Activity   Alcohol use: No   Drug use: No   Sexual activity: Not on file  Other Topics Concern   Not on file  Social History Narrative   Not on file   Social Drivers of Health   Financial Resource Strain: Not on file  Food Insecurity: Not on file  Transportation Needs: Not on file  Physical Activity: Not on file  Stress: Not on file  Social Connections: Not on file  Family History: The patient's family history includes CAD in his maternal aunt; CVA in his mother; Cancer in his father; Heart attack in his father; Heart attack (age of onset: 68) in his maternal grandfather; Heart disease in his father, maternal grandmother, mother, and paternal grandmother; Hyperlipidemia in his mother; Hypertension in his mother; Kidney disease in his mother; Stroke in his mother. ROS:   Please see the history of present illness.    All 14 point review of systems negative except as described per history of present illness.  EKGs/Labs/Other Studies Reviewed:    The following studies were reviewed today:  EKG:       Recent Labs: 05/23/2023: ALT 43; BUN 15; Creatinine, Ser 1.14; Hemoglobin 15.4; Platelets 214; Potassium 4.7; Sodium 141; TSH 1.590  Recent Lipid Panel     Component Value Date/Time   CHOL 204 (H) 05/23/2023 1123   TRIG 99 05/23/2023 1123   HDL 35 (L) 05/23/2023 1123   CHOLHDL 5.8 (H) 05/23/2023 1123   LDLCALC 151 (H) 05/23/2023 1123    Physical Exam:    VS:  BP (!) 166/100 (BP Location: Right Arm, Patient Position: Sitting)   Pulse 73   Ht 6\' 1"  (1.854 m)   Wt 261 lb 12.8 oz (118.8 kg)   SpO2 98%   BMI 34.54 kg/m     Wt Readings from Last 3 Encounters:  05/27/23 261 lb 12.8 oz (118.8 kg)  05/23/23 253 lb (114.8 kg)  04/22/23 255 lb (115.7 kg)     GEN:  Well nourished, well developed in no acute distress HEENT: Normal NECK: No JVD; No carotid bruits LYMPHATICS: No lymphadenopathy CARDIAC: RRR, no murmurs, no rubs, no gallops RESPIRATORY:  Clear to auscultation without rales, wheezing or rhonchi  ABDOMEN: Soft, non-tender, non-distended MUSCULOSKELETAL:  No edema; No deformity  SKIN: Warm and dry NEUROLOGIC:  Alert and oriented x 3 PSYCHIATRIC:  Normal affect   ASSESSMENT:    1. Essential hypertension   2. Paroxysmal atrial fibrillation (HCC)   3. Primary hypertension   4. Coronary artery disease involving native coronary artery of native heart without angina pectoris   5. Dyslipidemia    PLAN:    In order of problems listed above:  Coronary disease: Only minimal based on coronary CT angio but already presents with elevated calcium score.  Will initiate antiplatelet therapy with aspirin 81 mg daily, I warned him about signs and symptoms of coronary artery disease and ask him to let me know if he develop those. Dyslipidemia I did review his lab work test, he is LDL 151 HDL 35 clearly very poorly controlled, will initiate Crestor 10 mg daily, fasting lipid profile, AST LT 6 weeks. Primary hypertension, will initiate losartan 50 mg daily, Chem-7 will be done next week. Diabetes hemoglobin A1c 6.  Obviously he need to lose weight as he was working on will start ARB, will start aspirin, will start statin. Paroxysmal  atrial fibrillation stable on flecainide.  If he truly will have diagnosis of diabetes then he need to be anticoagulated long-term   Medication Adjustments/Labs and Tests Ordered: Current medicines are reviewed at length with the patient today.  Concerns regarding medicines are outlined above.  Orders Placed This Encounter  Procedures   EKG 12-Lead   No orders of the defined types were placed in this encounter.   Signed, Georgeanna Lea, MD, [facility]. 05/27/2023 3:17 PM    [facility]

## 2023-05-27 NOTE — Addendum Note (Signed)
 Addended by: Baldo Ash D on: 05/27/2023 03:39 PM   Modules accepted: Orders

## 2023-05-29 LAB — TSH: TSH: 1.59 u[IU]/mL (ref 0.450–4.500)

## 2023-05-29 LAB — COMPREHENSIVE METABOLIC PANEL
ALT: 43 IU/L (ref 0–44)
AST: 26 IU/L (ref 0–40)
Albumin: 4.7 g/dL (ref 3.8–4.9)
Alkaline Phosphatase: 70 IU/L (ref 44–121)
BUN/Creatinine Ratio: 13 (ref 9–20)
BUN: 15 mg/dL (ref 6–24)
Bilirubin Total: 0.4 mg/dL (ref 0.0–1.2)
CO2: 24 mmol/L (ref 20–29)
Calcium: 9.4 mg/dL (ref 8.7–10.2)
Chloride: 102 mmol/L (ref 96–106)
Creatinine, Ser: 1.14 mg/dL (ref 0.76–1.27)
Globulin, Total: 2.9 g/dL (ref 1.5–4.5)
Glucose: 108 mg/dL — ABNORMAL HIGH (ref 70–99)
Potassium: 4.7 mmol/L (ref 3.5–5.2)
Sodium: 141 mmol/L (ref 134–144)
Total Protein: 7.6 g/dL (ref 6.0–8.5)
eGFR: 75 mL/min/{1.73_m2} (ref >59)

## 2023-05-29 LAB — CBC WITH DIFFERENTIAL/PLATELET
Basophils Absolute: 0 10*3/uL (ref 0.0–0.2)
Basos: 1 %
EOS (ABSOLUTE): 0.1 10*3/uL (ref 0.0–0.4)
Eos: 2 %
Hematocrit: 45.8 % (ref 37.5–51.0)
Hemoglobin: 15.4 g/dL (ref 13.0–17.7)
Immature Grans (Abs): 0 10*3/uL (ref 0.0–0.1)
Immature Granulocytes: 1 %
Lymphocytes Absolute: 2.1 10*3/uL (ref 0.7–3.1)
Lymphs: 32 %
MCH: 31.2 pg (ref 26.6–33.0)
MCHC: 33.6 g/dL (ref 31.5–35.7)
MCV: 93 fL (ref 79–97)
Monocytes Absolute: 0.4 10*3/uL (ref 0.1–0.9)
Monocytes: 6 %
Neutrophils Absolute: 3.7 10*3/uL (ref 1.4–7.0)
Neutrophils: 58 %
Platelets: 214 10*3/uL (ref 150–450)
RBC: 4.93 x10E6/uL (ref 4.14–5.80)
RDW: 12.7 % (ref 11.6–15.4)
WBC: 6.5 10*3/uL (ref 3.4–10.8)

## 2023-05-29 LAB — LIPID PANEL
Chol/HDL Ratio: 5.8 ratio — ABNORMAL HIGH (ref 0.0–5.0)
Cholesterol, Total: 204 mg/dL — ABNORMAL HIGH (ref 100–199)
HDL: 35 mg/dL — ABNORMAL LOW (ref >39)
LDL Chol Calc (NIH): 151 mg/dL — ABNORMAL HIGH (ref 0–99)
Triglycerides: 99 mg/dL (ref 0–149)
VLDL Cholesterol Cal: 18 mg/dL (ref 5–40)

## 2023-05-29 LAB — INSULIN, RANDOM: INSULIN: 11 u[IU]/mL (ref 2.6–24.9)

## 2023-05-29 LAB — HEMOGLOBIN A1C
Est. average glucose Bld gHb Est-mCnc: 126 mg/dL
Hgb A1c MFr Bld: 6 % — ABNORMAL HIGH (ref 4.8–5.6)

## 2023-05-29 LAB — T4, FREE: Free T4: 1.19 ng/dL (ref 0.82–1.77)

## 2023-05-29 LAB — FOLATE: Folate: 10.8 ng/mL (ref >3.0)

## 2023-05-29 LAB — VITAMIN D 25 HYDROXY (VIT D DEFICIENCY, FRACTURES): Vit D, 25-Hydroxy: 36.3 ng/mL (ref 30.0–100.0)

## 2023-05-29 LAB — VITAMIN B12: Vitamin B-12: 428 pg/mL (ref 232–1245)

## 2023-05-30 ENCOUNTER — Other Ambulatory Visit: Payer: Self-pay

## 2023-05-30 DIAGNOSIS — I480 Paroxysmal atrial fibrillation: Secondary | ICD-10-CM

## 2023-05-30 MED ORDER — FLECAINIDE ACETATE 100 MG PO TABS: 100.0000 mg | ORAL_TABLET | Freq: Every day | ORAL | 2 refills | Status: DC

## 2023-06-06 ENCOUNTER — Ambulatory Visit (INDEPENDENT_AMBULATORY_CARE_PROVIDER_SITE_OTHER): Payer: 59 | Admitting: Family Medicine

## 2023-06-06 ENCOUNTER — Encounter (INDEPENDENT_AMBULATORY_CARE_PROVIDER_SITE_OTHER): Payer: Self-pay | Admitting: Family Medicine

## 2023-06-06 VITALS — BP 160/83 | HR 61 | Temp 97.9°F | Ht 71.0 in | Wt 254.0 lb

## 2023-06-06 DIAGNOSIS — E559 Vitamin D deficiency, unspecified: Secondary | ICD-10-CM | POA: Diagnosis not present

## 2023-06-06 DIAGNOSIS — I10 Essential (primary) hypertension: Secondary | ICD-10-CM | POA: Diagnosis not present

## 2023-06-06 DIAGNOSIS — E538 Deficiency of other specified B group vitamins: Secondary | ICD-10-CM

## 2023-06-06 DIAGNOSIS — E785 Hyperlipidemia, unspecified: Secondary | ICD-10-CM

## 2023-06-06 DIAGNOSIS — R7303 Prediabetes: Secondary | ICD-10-CM

## 2023-06-06 DIAGNOSIS — Z6835 Body mass index (BMI) 35.0-35.9, adult: Secondary | ICD-10-CM

## 2023-06-06 DIAGNOSIS — E669 Obesity, unspecified: Secondary | ICD-10-CM

## 2023-06-06 MED ORDER — CYANOCOBALAMIN 500 MCG PO TABS: 500.0000 ug | ORAL_TABLET | Freq: Every day | ORAL | Status: DC

## 2023-06-06 MED ORDER — VITAMIN D (ERGOCALCIFEROL) 1.25 MG (50000 UNIT) PO CAPS: 50000.0000 [IU] | ORAL_CAPSULE | ORAL | 1 refills | Status: DC

## 2023-06-06 NOTE — Progress Notes (Signed)
 [name], D.O.  ABFM, ABOM Clinical Bariatric Medicine Physician  Office located at: [address] [place], [place]  [address]   Assessment and Plan:  No orders of the defined types were placed in this encounter.   There are no discontinued medications.   Meds ordered this encounter  Medications   cyanocobalamin (VITAMIN B12) 500 MCG tablet    Sig: Take 1 tablet (500 mcg total) by mouth daily. In form of MVI or B   Vitamin D, Ergocalciferol, (DRISDOL) 1.25 MG (50000 UNIT) CAPS capsule    Sig: Take 1 capsule (50,000 Units total) by mouth every 7 (seven) days.    Dispense:  4 capsule    Refill:  1     FOR THE DISEASE OF OBESITY:  BMI 35.0-35.9,adult - current BMI 35.44 Obesity, Beginning BMI 35.6 Assessment & Plan: Since last office visit on 05/23/2023 patient's muscle mass has increased by 1 lb. Fat mass has decreased by 1 lb. Total body water has decreased by 1.4 lb.  Counseling done on how various foods will affect these numbers and how to maximize success  Total lbs lost to date: + 1 lb Total weight loss percentage to date: +0.40%    Recommended Dietary Goals Ennio is currently in the action stage of change. As such, his goal is to continue weight management plan.  He has agreed to: continue current plan (Category 3 MP with B/L choices and only 100 snack calories) - today pt was also provided option to journal lunch (250-350 calories and 35++ grams protein) and dinner (450-600 calories & 50++ grams protein).    Behavioral Intervention We discussed the following today: increasing lean protein intake to established goals, avoiding skipping meals, planning for success, focusing on food with a 10:1 ratio of calories: grams of protein, and discussed pre-packaged healthier meals such as [employer] meal prep.  Additional resources provided today: Handout on complex carbohydrates, Handout on CAT 3 meal plan, Handout on CAT 3-4 lunch options, Handout on slow cooker  recipes, Handout on Pre-Diabetes education , and Handout on insulin resistance education  Evidence-based interventions for health behavior change were utilized today including the discussion of self monitoring techniques, problem-solving barriers and SMART goal setting techniques.  Regarding patient's less desirable eating habits and patterns, we employed the technique of small changes.   Pt will specifically work on: meal prepping and planning   Recommended Physical Activity Goals Davontay has been advised to work up to 150 minutes of moderate intensity aerobic activity a week and strengthening exercises 2-3 times per week for cardiovascular health, weight loss maintenance and preservation of muscle mass.   He has agreed to : Think about enjoyable ways to increase daily physical activity and overcoming barriers to exercise   Pharmacotherapy We both agreed to : continue with nutritional and behavioral strategies   FOR ASSOCIATED CONDITIONS ADDRESSED TODAY:  Prediabetes - new onset Assessment & Plan: Lab Results  Component Value Date   HGBA1C 6.0 (H) 05/23/2023   INSULIN 11.0 05/23/2023      Component Value Date/Time   PROT 7.6 05/23/2023 1123   ALBUMIN 4.7 05/23/2023 1123   AST 26 05/23/2023 1123   ALT 43 05/23/2023 1123   ALKPHOS 70 05/23/2023 1123   BILITOT 0.4 05/23/2023 1123   Lab Results  Component Value Date   WBC 6.5 05/23/2023   HGB 15.4 05/23/2023   HCT 45.8 05/23/2023   MCV 93 05/23/2023   PLT 214 05/23/2023   Lab Results  Component Value Date   TSH 1.590 05/23/2023   FREET4 1.19 05/23/2023   Lab Results  Component Value Date   VITAMINB12 428 05/23/2023   FOLATE 10.8 05/23/2023    His Hemoglobin A1c is consistent with the pre-diabetes range. Fasting insulin should be <5. His liver enzymes, blood counts, thyroid levels, and folate are WNL.  Patient aware of disease state regarding his Pre-DM. Explained role of simple carbs and insulin levels on hunger and  cravings. Educated patient that having adequate amounts of protein with each meal is important for increasing muscle mass, stabilizing sugars, controlling hunger and cravings, and improving thermogenesis. Encouraged pt to read more about this condition on the American Diabetes Association website. Continue working on nutrition plan to decrease simple carbohydrates, increase lean proteins and exercise to promote weight loss and improve glycemic control and prevent progression to T2DM.   Primary hypertension Assessment & Plan: Last 3 blood pressure readings in our office are as follows: BP Readings from Last 3 Encounters:  06/06/23 (!) 160/83  05/27/23 (!) 166/100  05/23/23 (!) 178/108   Lab Results  Component Value Date   CREATININE 1.14 05/23/2023   BUN 15 05/23/2023   NA 141 05/23/2023   K 4.7 05/23/2023   CL 102 05/23/2023   CO2 24 05/23/2023   H/o chronic atrial fibrillation - managed by Dr Elberta Fortis, the EP cardiologist. Currently on flecainide and metoprolol. Additionally, pt met with general cardiologist Dr.Krasowski on 05/27/2023. He was started on Losartan 50 mg daily. His blood pressure is above target today - pt asx. Pt checked his BP on one occasion since LOV - on March 23 his BP was 152/100. Kidney function and electrolytes WNL.  Goal BP: 130/80 or less. Encouraged pt to monitor his blood pressure more regularly. Discussed that adequate hydration, exercise, controlling blood sugars/blood pressure, and avoiding NSAIDs are important for kidney health. He has a f/up with Dr.Krasowski on 4/30. Will continue to monitor alongside specialists.    Dyslipidemia Assessment & Plan: Lab Results  Component Value Date   CHOL 204 (H) 05/23/2023   HDL 35 (L) 05/23/2023   LDLCALC 151 (H) 05/23/2023   TRIG 99 05/23/2023   CHOLHDL 5.8 (H) 05/23/2023   The 10-year ASCVD risk score (Arnett DK, et al., 2019) is: 14.3%   Values used to calculate the score:     Age: 58 years     Sex: Male      Is Non-Hispanic African American: No     Diabetic: No     Tobacco smoker: No     Systolic Blood Pressure: 160 mmHg     Is BP treated: Yes     HDL Cholesterol: 35 mg/dL     Total Cholesterol: 204 mg/dL  Pt met with general cardiologist Dr.Krasowski on 05/27/2023. He was started on  Crestor 10 mg daily. His LDL and HDL are poorly controlled. LDL should be <70. Pt advised to reduce saturated and trans fats in diet. Encouraged aerobic activity with eventual goal of a minimum of 150+ min wk plus 2 days/ week of resistance or strength training. Recommend pt to explore the American Heart Association website for further information about high cholesterol. Continue weight loss therapy and statin.   B12 insufficiency- new onset Assessment & Plan: Lab Results  Component Value Date   VITAMINB12 428 05/23/2023   B12 level is 428, not at goal of over 500.  This diagnosis was reviewed with the patient and education was provided. Shared decision making: START OTC  B12 500 mcg daily. Recheck - 3 mos.   Relevant Orders:  -     Cyanocobalamin; Take 1 tablet (500 mcg total) by mouth daily. In form of MVI or B   Vitamin D deficiency - new onset Assessment & Plan: Lab Results  Component Value Date   VD25OH 36.3 05/23/2023   VD level is not at goal of 50-70. I discussed the importance of vitamin D to the patient's health and well-being as well as to their ability to lose weight. Shared decision making: START ERGO 50,000 units weekly. Recheck - 3 mos.   Relevant Orders:  -     Vitamin D (Ergocalciferol); Take 1 capsule (50,000 Units total) by mouth every 7 (seven) days.  Dispense: 4 capsule; Refill: 1   FOLLOW UP:   Return 06/24/2023. He was informed of the importance of frequent follow up visits to maximize his success with intensive lifestyle modifications for his multiple health conditions.  Subjective:   Chief complaint: Obesity Maury is here to discuss his progress with his obesity treatment  plan. He is on the Category 3 MP with B/L choices and only 100 snack calories and states he is following his eating plan approximately 40 % of the time. He states he is not exercising.  Interval History:  MOISHE SCHELLENBERG is here today for his first follow-up office visit since starting the program with [place]. Since last OV, pt is up 1 lb. States that he formally started his MP last week. Reports that there was not a day where he was able to eat everything on plan. Typically skips breakfast. Identifies that his biggest barrier  is time. He leaves for work at 7 am and gets off at 4 pm. However, he returns home at around 9 pm b/c he is spending time/taking care of his ex-wife's 75 y.o daughter.  Pharmacotherapy for weight loss: none  Review of Systems:  Pertinent positives were addressed with patient today.  Reviewed by clinician on day of visit: allergies, medications, problem list, medical history, surgical history, family history, social history, and previous encounter notes.  Weight Summary and Biometrics   Weight Lost Since Last Visit: 0  Weight Gained Since Last Visit: 1lb   Vitals Temp: 97.9 F (36.6 C) BP: (!) 160/83 Pulse Rate: 61 SpO2: 98 %   Anthropometric Measurements Height: 5\' 11"  (1.803 m) Weight: 254 lb (115.2 kg) BMI (Calculated): 35.44 Weight at Last Visit: 253lb Weight Lost Since Last Visit: 0 Weight Gained Since Last Visit: 1lb Starting Weight: 253lb Total Weight Loss (lbs): 0 lb (0 kg) Peak Weight: 266lb   Body Composition  Body Fat %: 34.8 % Fat Mass (lbs): 88.4 lbs Muscle Mass (lbs): 157.4 lbs Total Body Water (lbs): 113.8 lbs Visceral Fat Rating : 20   Other Clinical Data Fasting: no Labs: no Today's Visit #: 2 Starting Date: 05/23/23   Objective:   PHYSICAL EXAM:  Blood pressure (!) 160/83, pulse 61, temperature 97.9 F (36.6 C), height 5\' 11"  (1.803 m), weight 254 lb (115.2 kg), SpO2 98%. Body mass index is 35.43 kg/m.  General: he is  overweight, cooperative and in no acute distress.   HEENT: EOMI, sclerae are anicteric. Lungs: Normal breathing effort, no conversational dyspnea. M-Sk:  Normal gross ROM * 4 extremities  PSYCH: Has normal mood, affect and thought process. Neurologic: No gross sensory or motor deficits. Well developed, A and O * 3  DIAGNOSTIC DATA REVIEWED:  BMET    Component Value Date/Time   NA  141 05/23/2023 1123   K 4.7 05/23/2023 1123   CL 102 05/23/2023 1123   CO2 24 05/23/2023 1123   GLUCOSE 108 (H) 05/23/2023 1123   GLUCOSE 137 (H) 12/08/2011 0910   BUN 15 05/23/2023 1123   CREATININE 1.14 05/23/2023 1123   CALCIUM 9.4 05/23/2023 1123   GFRNONAA 68 (L) 12/08/2011 0910   GFRAA 79 (L) 12/08/2011 0910   Lab Results  Component Value Date   HGBA1C 6.0 (H) 05/23/2023   Lab Results  Component Value Date   INSULIN 11.0 05/23/2023   Lab Results  Component Value Date   TSH 1.590 05/23/2023   CBC    Component Value Date/Time   WBC 6.5 05/23/2023 1123   WBC 11.6 (H) 12/08/2011 0910   RBC 4.93 05/23/2023 1123   RBC 5.01 12/08/2011 0910   HGB 15.4 05/23/2023 1123   HCT 45.8 05/23/2023 1123   PLT 214 05/23/2023 1123   MCV 93 05/23/2023 1123   MCH 31.2 05/23/2023 1123   MCH 31.7 12/08/2011 0910   MCHC 33.6 05/23/2023 1123   MCHC 36.1 (H) 12/08/2011 0910   RDW 12.7 05/23/2023 1123   Iron Studies No results found for: "IRON", "TIBC", "FERRITIN", "IRONPCTSAT" Lipid Panel     Component Value Date/Time   CHOL 204 (H) 05/23/2023 1123   TRIG 99 05/23/2023 1123   HDL 35 (L) 05/23/2023 1123   CHOLHDL 5.8 (H) 05/23/2023 1123   LDLCALC 151 (H) 05/23/2023 1123   Hepatic Function Panel     Component Value Date/Time   PROT 7.6 05/23/2023 1123   ALBUMIN 4.7 05/23/2023 1123   AST 26 05/23/2023 1123   ALT 43 05/23/2023 1123   ALKPHOS 70 05/23/2023 1123   BILITOT 0.4 05/23/2023 1123      Component Value Date/Time   TSH 1.590 05/23/2023 1123   Nutritional Lab Results  Component  Value Date   VD25OH 36.3 05/23/2023    Attestations:   I, Special Puri, acting as a Stage manager for [employer], DO., have compiled all relevant documentation for today's office visit on behalf of Thomasene Lot, DO, while in the presence of [employer], DO.  Reviewed by clinician on day of visit: allergies, medications, problem list, medical history, surgical history, family history, social history, and previous encounter notes pertinent to patient's obesity diagnosis.   I have spent 65 minutes in the care of the patient today including: preparing to see patient (e.g. review and interpretation of tests, old notes ), obtaining and/or reviewing separately obtained history, performing a medically appropriate examination or evaluation, counseling and educating the patient, ordering medications, test or procedures, documenting clinical information in the electronic or other health care record, and independently interpreting results and communicating results to the patient, family, or caregiver   I have reviewed the above documentation for accuracy and completeness, and I agree with the above. [name], D.O.  The 21st Century Cures Act was signed into law in 2016 which includes the topic of electronic health records.  This provides immediate access to information in MyChart.  This includes consultation notes, operative notes, office notes, lab results and pathology reports.  If you have any questions about what you read please let us know at your next visit so we can discuss your concerns and take corrective action if need be.  We are right here with you.

## 2023-06-07 ENCOUNTER — Telehealth: Payer: Self-pay

## 2023-06-07 LAB — BASIC METABOLIC PANEL WITH GFR
BUN/Creatinine Ratio: 16 (ref 9–20)
BUN: 17 mg/dL (ref 6–24)
CO2: 22 mmol/L (ref 20–29)
Calcium: 9.1 mg/dL (ref 8.7–10.2)
Chloride: 103 mmol/L (ref 96–106)
Creatinine, Ser: 1.09 mg/dL (ref 0.76–1.27)
Glucose: 120 mg/dL — ABNORMAL HIGH (ref 70–99)
Potassium: 4.7 mmol/L (ref 3.5–5.2)
Sodium: 141 mmol/L (ref 134–144)
eGFR: 80 mL/min/{1.73_m2} (ref >59)

## 2023-06-07 NOTE — Telephone Encounter (Signed)
 Pt viewed lab results on My Chart per Dr. Vanetta Shawl note. Routed to PCP.

## 2023-06-07 NOTE — Telephone Encounter (Signed)
 Left message on My Chart with normal lab results per Dr. Vanetta Shawl note. Routed to PCP.

## 2023-06-17 ENCOUNTER — Other Ambulatory Visit: Payer: Self-pay

## 2023-06-17 DIAGNOSIS — I480 Paroxysmal atrial fibrillation: Secondary | ICD-10-CM

## 2023-06-17 MED ORDER — METOPROLOL SUCCINATE ER 25 MG PO TB24: 25.0000 mg | ORAL_TABLET | Freq: Every day | ORAL | 2 refills | Status: DC

## 2023-06-24 ENCOUNTER — Ambulatory Visit (INDEPENDENT_AMBULATORY_CARE_PROVIDER_SITE_OTHER): Admitting: Adult Health

## 2023-06-24 ENCOUNTER — Encounter (INDEPENDENT_AMBULATORY_CARE_PROVIDER_SITE_OTHER): Payer: Self-pay | Admitting: Adult Health

## 2023-06-24 VITALS — BP 135/75 | HR 66 | Temp 97.8°F | Ht 71.0 in | Wt 243.0 lb

## 2023-06-24 DIAGNOSIS — R7303 Prediabetes: Secondary | ICD-10-CM

## 2023-06-24 DIAGNOSIS — E538 Deficiency of other specified B group vitamins: Secondary | ICD-10-CM | POA: Diagnosis not present

## 2023-06-24 DIAGNOSIS — E785 Hyperlipidemia, unspecified: Secondary | ICD-10-CM | POA: Diagnosis not present

## 2023-06-24 DIAGNOSIS — I10 Essential (primary) hypertension: Secondary | ICD-10-CM | POA: Diagnosis not present

## 2023-06-24 DIAGNOSIS — Z6833 Body mass index (BMI) 33.0-33.9, adult: Secondary | ICD-10-CM

## 2023-06-24 DIAGNOSIS — Z6835 Body mass index (BMI) 35.0-35.9, adult: Secondary | ICD-10-CM

## 2023-06-24 DIAGNOSIS — E559 Vitamin D deficiency, unspecified: Secondary | ICD-10-CM

## 2023-06-24 DIAGNOSIS — E669 Obesity, unspecified: Secondary | ICD-10-CM

## 2023-06-24 MED ORDER — CYANOCOBALAMIN 500 MCG PO TABS: 500.0000 ug | ORAL_TABLET | Freq: Every day | ORAL | Status: DC

## 2023-06-24 MED ORDER — VITAMIN D (ERGOCALCIFEROL) 1.25 MG (50000 UNIT) PO CAPS: 50000.0000 [IU] | ORAL_CAPSULE | ORAL | 1 refills | Status: DC

## 2023-06-24 NOTE — Progress Notes (Signed)
 WEIGHT SUMMARY AND BIOMETRICS  Vitals Temp: 97.8 F (36.6 C) BP: 135/75 Pulse Rate: 66 SpO2: 98 %   Anthropometric Measurements Height: 5\' 11"  (1.803 m) Weight: 243 lb (110.2 kg) BMI (Calculated): 33.91 Weight at Last Visit: 254 lb Weight Lost Since Last Visit: 11 lb Weight Gained Since Last Visit: 0 Starting Weight: 253 lb Total Weight Loss (lbs): 11 lb (4.99 kg) Peak Weight: 266 lb   Body Composition  Body Fat %: 33.1 % Fat Mass (lbs): 80.6 lbs Muscle Mass (lbs): 154.8 lbs Total Body Water (lbs): 110.6 lbs Visceral Fat Rating : 18   Other Clinical Data RMR: [id number] Fasting: no Labs: no Today's Visit #: 3 Starting Date: 05/23/23    Chief Complaint:   OBESITY Randall Smith is here to discuss his progress with his obesity treatment plan.  He is on the the Category 3 Plan and states he is following his eating plan approximately 90 % of the time.  He states he is exercising: NEAT Activities   Interim History:  The last several weeks, he has been very consistent following prescribed Cat 3 MP He he is please with weight loss since last OV at [facility]  Reviewed Bioimpedance Results with pt: Muscle Mass: -2.6 lbs Adipose Mass: -7.8 lbs  Hunger/appetite-appetite stable the last several weeks.  Cravings- he endorses reduce cravings for sweet tea when eating out. Craving reduction the last several weeks  Hydration-He estimates to drink at least 40 oz plain water/day  Subjective:   1. Dyslipidemia General cardiologist OV with Dr.Krasowski on 05/27/2023.  He was started on  Crestor 10 mg daily. His LDL and HDL are poorly controlled. LDL should be <70. He denies myalgias He estimates to drink at least 40 oz plain water/day  2. 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 B12 insufficiency- new onset  Latest Reference Range & Units 05/23/23 11:23  Vitamin B12 232 - 1,245 pg/mL 428   He is taking daily oral B12 supplementation  4. Prediabetes - new onset Lab Results  Component Value Date   HGBA1C 6.0 (H) 05/23/2023    The last several weeks, he has been very consistent following prescribed Cat 3 MP He he is please with weight loss since last OV at [facility]  5. Vit D Def  Latest Reference Range & Units 05/23/23 11:23  Vitamin D, 25-Hydroxy 30.0 - 100.0 ng/mL 36.3   Vit D Level sub therapeutic He is on weekly Ergocalciferol- denies N/V/Muscle Weakness  Assessment/Plan:   1. Dyslipidemia Continue healthy eating and daily statin  2. Hypertension, unspecified type (Primary) Continue healthy eating  Continue losartan (COZAAR) 50 MG tablet  rosuvastatin (CRESTOR) 10 MG tablet  aspirin EC 81 MG tablet  flecainide (TAMBOCOR) 100 MG tablet  metoprolol succinate (TOPROL-XL) 25 MG 24 hr tablet   3. B12 insufficiency- new onset Refill  cyanocobalamin (VITAMIN B12) 500 MCG tablet Take 1 tablet (500 mcg total) by mouth daily. In form of MVI or B   4. Prediabetes - new onset Continue healthy eating Remain active throughout day  5. Vit D Def Refill Vitamin D, Ergocalciferol, (DRISDOL) 1.25 MG (50000 UNIT)  CAPS capsule Take 1 capsule (50,000 Units total) by mouth every 7 (seven) days. Dispense: 4 capsule, Refills: 1 ordered   6. Obesity, CURRENT BMI 33.9  Randall Smith is currently in the action stage of change. As such, his goal is to continue with weight loss efforts. He has agreed to the Category 3 Plan.   Exercise goals: No exercise has been prescribed at this time.  Behavioral modification strategies: increasing lean protein intake, decreasing simple carbohydrates, increasing vegetables, increasing water intake, no  skipping meals, meal planning and cooking strategies, keeping healthy foods in the home, and planning for success.  Randall Smith has agreed to follow-up with our clinic in 4 weeks. He was informed of the importance of frequent follow-up visits to maximize his success with intensive lifestyle modifications for his multiple health conditions.   Objective:   Blood pressure 135/75, pulse 66, temperature 97.8 F (36.6 C), height 5\' 11"  (1.803 m), weight 243 lb (110.2 kg), SpO2 98%. Body mass index is 33.89 kg/m.  General: Cooperative, alert, well developed, in no acute distress. HEENT: Conjunctivae and lids unremarkable. Cardiovascular: Regular rhythm.  Lungs: Normal work of breathing. Neurologic: No focal deficits.   Lab Results  Component Value Date   CREATININE 1.09 06/06/2023   BUN 17 06/06/2023   NA 141 06/06/2023   K 4.7 06/06/2023   CL 103 06/06/2023   CO2 22 06/06/2023   Lab Results  Component Value Date   ALT 43 05/23/2023   AST 26 05/23/2023   ALKPHOS 70 05/23/2023   BILITOT 0.4 05/23/2023   Lab Results  Component Value Date   HGBA1C 6.0 (H) 05/23/2023   Lab Results  Component Value Date   INSULIN 11.0 05/23/2023   Lab Results  Component Value Date   TSH 1.590 05/23/2023   Lab Results  Component Value Date   CHOL 204 (H) 05/23/2023   HDL 35 (L) 05/23/2023   LDLCALC 151 (H) 05/23/2023   TRIG 99 05/23/2023   CHOLHDL 5.8 (H) 05/23/2023   Lab Results  Component Value Date   VD25OH 36.3 05/23/2023   Lab Results  Component Value Date   WBC 6.5 05/23/2023   HGB 15.4 05/23/2023   HCT 45.8 05/23/2023   MCV 93 05/23/2023   PLT 214 05/23/2023   No results found for: "IRON", "TIBC", "FERRITIN"  Attestation Statements:   Reviewed by clinician on day of visit: allergies, medications, problem list, medical history, surgical history, family history, social history, and previous encounter notes.  I have reviewed the above documentation for accuracy and completeness,  and I agree with the above. -  Randall Savino d. Latravious Levitt, NP-C

## 2023-07-09 ENCOUNTER — Encounter: Payer: Self-pay | Admitting: Cardiology

## 2023-07-10 ENCOUNTER — Encounter: Payer: Self-pay | Admitting: Cardiology

## 2023-07-10 ENCOUNTER — Ambulatory Visit: Attending: Cardiology | Admitting: Cardiology

## 2023-07-10 VITALS — BP 138/90 | HR 64 | Ht 72.0 in | Wt 244.6 lb

## 2023-07-10 DIAGNOSIS — I480 Paroxysmal atrial fibrillation: Secondary | ICD-10-CM

## 2023-07-10 DIAGNOSIS — R002 Palpitations: Secondary | ICD-10-CM

## 2023-07-10 DIAGNOSIS — E785 Hyperlipidemia, unspecified: Secondary | ICD-10-CM

## 2023-07-10 DIAGNOSIS — I2510 Atherosclerotic heart disease of native coronary artery without angina pectoris: Secondary | ICD-10-CM | POA: Diagnosis not present

## 2023-07-10 DIAGNOSIS — I10 Essential (primary) hypertension: Secondary | ICD-10-CM | POA: Diagnosis not present

## 2023-07-10 MED ORDER — LOSARTAN POTASSIUM 100 MG PO TABS: 100.0000 mg | ORAL_TABLET | Freq: Every day | ORAL | 3 refills | Status: AC

## 2023-07-10 NOTE — Progress Notes (Signed)
 Cardiology Office Note:    Date:  07/10/2023   [name], DOB 1966/01/24, MRN [id number]  PCP:  Tisovec, Richard W, MD  Cardiologist:  Ralene Burger, MD    Referring MD: Suzzanne Estrin, MD   Chief Complaint  Patient presents with   Follow-up    History of Present Illness:    Randall Smith is a 58 y.o. male past medical history significant for essential hypertension, paroxysmal atrial fibrillation successfully suppressed with flecainide , CHADS2 Vascor only 1, he did have coronary CT angio which showed "elevated calcium  score however only mild nonobstructive disease between 0 and 25% stenosis.  Does have dyslipidemia.  Comes today to months for follow-up doing great denies have any palpitations no chest pain tightness squeezing pressure burning chest  Past Medical History:  Diagnosis Date   A-fib (HCC)    Back pain    Chest pain    Chest pain    Dizzy spells    Elevated glucose    GERD (gastroesophageal reflux disease)    GERD (gastroesophageal reflux disease)    High blood pressure    Joint pain    Kidney stone    Low HDL (under 40)    Lung nodule    Overweight    Palpitations    Palpitations    Racing heart beat    SOB (shortness of breath)    Swelling of lower extremity     Past Surgical History:  Procedure Laterality Date   KNEE SURGERY Left 1996    Current Medications: Current Meds  Medication Sig   aspirin  EC 81 MG tablet Take 1 tablet (81 mg total) by mouth daily. Swallow whole.   cyanocobalamin  (VITAMIN B12) 500 MCG tablet Take 1 tablet (500 mcg total) by mouth daily. In form of MVI or B   flecainide  (TAMBOCOR ) 100 MG tablet Take 1 tablet (100 mg total) by mouth daily.   losartan  (COZAAR ) 50 MG tablet Take 1 tablet (50 mg total) by mouth daily.   metoprolol  succinate (TOPROL -XL) 25 MG 24 hr tablet Take 1 tablet (25 mg total) by mouth daily.   rosuvastatin  (CRESTOR ) 10 MG tablet Take 1 tablet (10 mg total) by mouth daily.   Vitamin D ,  Ergocalciferol , (DRISDOL ) 1.25 MG (50000 UNIT) CAPS capsule Take 1 capsule (50,000 Units total) by mouth every 7 (seven) days.     Allergies:   Patient has no known allergies.   Social History   Socioeconomic History   Marital status: Married    Spouse name: Not on file   Number of children: Not on file   Years of education: Not on file   Highest education level: Not on file  Occupational History   Not on file  Tobacco Use   Smoking status: Never   Smokeless tobacco: Never  Substance and Sexual Activity   Alcohol use: No   Drug use: No   Sexual activity: Not on file  Other Topics Concern   Not on file  Social History Narrative   Not on file   Social Drivers of Health   Financial Resource Strain: Not on file  Food Insecurity: Not on file  Transportation Needs: Not on file  Physical Activity: Not on file  Stress: Not on file  Social Connections: Not on file     Family History: The patient's family history includes CAD in his maternal aunt; CVA in his mother; Cancer in his father; Heart attack in his father; Heart attack (age of onset:  50) in his maternal grandfather; Heart disease in his father, maternal grandmother, mother, and paternal grandmother; Hyperlipidemia in his mother; Hypertension in his mother; Kidney disease in his mother; Stroke in his mother. ROS:   Please see the history of present illness.    All 14 point review of systems negative except as described per history of present illness  EKGs/Labs/Other Studies Reviewed:    EKG Interpretation Date/Time:  Wednesday July 10 2023 08:45:33 EDT Ventricular Rate:  64 PR Interval:  154 QRS Duration:  88 QT Interval:  416 QTC Calculation: 429 R Axis:   53  Text Interpretation: Normal sinus rhythm Normal ECG When compared with ECG of 27-May-2023 14:49, No significant change was found Confirmed by Ralene Burger [phone]) on 07/10/2023 8:46:43 AM    Recent Labs: 05/23/2023: ALT 43; Hemoglobin 15.4; Platelets  214; TSH 1.590 06/06/2023: BUN 17; Creatinine, Ser 1.09; Potassium 4.7; Sodium 141  Recent Lipid Panel    Component Value Date/Time   CHOL 204 (H) 05/23/2023 1123   TRIG 99 05/23/2023 1123   HDL 35 (L) 05/23/2023 1123   CHOLHDL 5.8 (H) 05/23/2023 1123   LDLCALC 151 (H) 05/23/2023 1123    Physical Exam:    VS:  BP (!) 140/90 (BP Location: Left Arm, Patient Position: Sitting)   Pulse 64   Ht 6' (1.829 m)   Wt 244 lb 9.6 oz (110.9 kg)   SpO2 96%   BMI 33.17 kg/m     Wt Readings from Last 3 Encounters:  07/10/23 244 lb 9.6 oz (110.9 kg)  06/24/23 243 lb (110.2 kg)  06/06/23 254 lb (115.2 kg)     GEN:  Well nourished, well developed in no acute distress HEENT: Normal NECK: No JVD; No carotid bruits LYMPHATICS: No lymphadenopathy CARDIAC: RRR, no murmurs, no rubs, no gallops RESPIRATORY:  Clear to auscultation without rales, wheezing or rhonchi  ABDOMEN: Soft, non-tender, non-distended MUSCULOSKELETAL:  No edema; No deformity  SKIN: Warm and dry LOWER EXTREMITIES: no swelling NEUROLOGIC:  Alert and oriented x 3 PSYCHIATRIC:  Normal affect   ASSESSMENT:    1. Essential hypertension   2. Paroxysmal atrial fibrillation (HCC)   3. Coronary artery disease involving native coronary artery of native heart without angina pectoris   4. Dyslipidemia   5. Palpitations    PLAN:    In order of problems listed above:  Paroxysmal atrial fibrillation seems to be controlled with present management will continue. Essential hypertension still uncontrolled we will double the dose of losartan  to 100 mg daily, Chem-7 will be checked next week. Coronary disease nonobstructive asymptomatic decrease risk factors modifications which will continue. Dyslipidemia last K PN show me LDL 151 HDL 35 at that time we started Crestor  10 mg daily, will check his fasting lipid profile since Crestor  has been already more than a month   Medication Adjustments/Labs and Tests Ordered: Current medicines  are reviewed at length with the patient today.  Concerns regarding medicines are outlined above.  Orders Placed This Encounter  Procedures   EKG 12-Lead   Medication changes: No orders of the defined types were placed in this encounter.   Signed, Manfred Seed, MD, [facility] 07/10/2023 8:58 AM    [facility]

## 2023-07-10 NOTE — Patient Instructions (Signed)
 Medication Instructions:  Your physician has recommended you make the following change in your medication:   Increase your Losartan  to 100 mg daily  *If you need a refill on your cardiac medications before your next appointment, please call your pharmacy*   Lab Work: Your physician recommends that you return for lab work in: next week CMP and fasting lipids You need to have labs done when you are fasting.  You can come Monday through Friday 8:30 am to 12:00 pm and 1:15 to 4:30. You do not need to make an appointment as the order has already been placed.   If you have labs (blood work) drawn today and your tests are completely normal, you will receive your results only by: MyChart Message (if you have MyChart) OR A paper copy in the mail If you have any lab test that is abnormal or we need to change your treatment, we will call you to review the results.   Testing/Procedures: None ordered   Follow-Up: At [facility], you and your health needs are our priority.  As part of our continuing mission to provide you with exceptional heart care, we have created designated Provider Care Teams.  These Care Teams include your primary Cardiologist (physician) and Advanced Practice Providers (APPs -  Physician Assistants and Nurse Practitioners) who all work together to provide you with the care you need, when you need it.  We recommend signing up for the patient portal called "MyChart".  Sign up information is provided on this After Visit Summary.  MyChart is used to connect with patients for Virtual Visits (Telemedicine).  Patients are able to view lab/test results, encounter notes, upcoming appointments, etc.  Non-urgent messages can be sent to your provider as well.   To learn more about what you can do with MyChart, go to [email or web address].    Your next appointment:   6 month(s)  The format for your next appointment:   In Person  Provider:   Ralene Burger, MD    Other  Instructions none  Important Information About Sugar

## 2023-07-10 NOTE — Addendum Note (Signed)
 Addended by: Einar Grave on: 07/10/2023 09:06 AM   Modules accepted: Orders

## 2023-07-11 ENCOUNTER — Ambulatory Visit (INDEPENDENT_AMBULATORY_CARE_PROVIDER_SITE_OTHER): Admitting: Family Medicine

## 2023-07-11 ENCOUNTER — Encounter (INDEPENDENT_AMBULATORY_CARE_PROVIDER_SITE_OTHER): Payer: Self-pay | Admitting: Family Medicine

## 2023-07-11 VITALS — BP 136/63 | HR 69 | Temp 97.8°F | Ht 71.0 in | Wt 239.0 lb

## 2023-07-11 DIAGNOSIS — E559 Vitamin D deficiency, unspecified: Secondary | ICD-10-CM

## 2023-07-11 DIAGNOSIS — E785 Hyperlipidemia, unspecified: Secondary | ICD-10-CM | POA: Diagnosis not present

## 2023-07-11 DIAGNOSIS — Z6835 Body mass index (BMI) 35.0-35.9, adult: Secondary | ICD-10-CM

## 2023-07-11 DIAGNOSIS — R7303 Prediabetes: Secondary | ICD-10-CM

## 2023-07-11 DIAGNOSIS — I10 Essential (primary) hypertension: Secondary | ICD-10-CM

## 2023-07-11 DIAGNOSIS — Z6833 Body mass index (BMI) 33.0-33.9, adult: Secondary | ICD-10-CM

## 2023-07-11 DIAGNOSIS — E669 Obesity, unspecified: Secondary | ICD-10-CM

## 2023-07-11 MED ORDER — VITAMIN D (ERGOCALCIFEROL) 1.25 MG (50000 UNIT) PO CAPS: 50000.0000 [IU] | ORAL_CAPSULE | ORAL | 1 refills | Status: DC

## 2023-07-11 NOTE — Progress Notes (Signed)
 Randall Smith, D.O.  ABFM, ABOM Specializing in Clinical Bariatric Medicine  Office located at: [address] [place], [place]  [address]   Assessment and Plan:  No orders of the defined types were placed in this encounter.   There are no discontinued medications.   No orders of the defined types were placed in this encounter.    FOR THE DISEASE OF OBESITY:  BMI 35.0-35.9,adult - current BMI 33.35 Obesity, Beginning BMI 33.9 Assessment & Plan: Since last office visit on 06/24/2023 patient's  Muscle mass has decreased by 3 lb. Fat mass has decreased by 0.6 lb. Total body water has increased by 1 lb.  Counseling done on how various foods will affect these numbers and how to maximize success  Total lbs lost to date: 14 lbs Total weight loss percentage to date: 5.53%    Recommended Dietary Goals Avetis is currently in the action stage of change. As such, his goal is to continue weight management plan.  He has agreed to: continue current plan   Behavioral Intervention We discussed the following today: strategies to increasing lean protein intake to established goals and increasing water intake   Additional resources provided today: Handout on low glycemic and low calorie fruits and veggies  Evidence-based interventions for health behavior change were utilized today including the discussion of self monitoring techniques, problem-solving barriers and SMART goal setting techniques.   Regarding patient's less desirable eating habits and patterns, we employed the technique of small changes.   Pt will specifically work on: increasing his lean protein intake   Recommended Physical Activity Goals Christophe has been advised to work up to 300-450 minutes of moderate intensity aerobic activity a week and strengthening exercises 2-3 times per week for cardiovascular health, weight loss maintenance and preservation of muscle mass.   He has agreed to : continue to gradually increase  the amount and intensity of exercise routine   Pharmacotherapy We both agreed to : continue with nutritional and behavioral strategies   ASSOCIATED CONDITIONS ADDRESSED TODAY:  Essential hypertension Assessment & Plan: Last 3 blood pressure readings in our office are as follows: BP Readings from Last 3 Encounters:  07/11/23 136/63  07/10/23 (!) 138/90  06/24/23 135/75   The 10-year ASCVD risk score (Arnett DK, et al., 2019) is: 10.9%  Lab Results  Component Value Date   CREATININE 1.09 06/06/2023   Reviewed cardiology note dated 07/10/2023. Blood pressure stable. On Toprol -XL 25 mg daily and   Metoprolol , tambococo for H/o of A-Fib  H/o of A-fib.    Continue checking BP and heart rate at home.   Cardilogy increased losartan  to improve BP controled will be getting fasting blood work next OV.   Caridology is gonna recheck his caridology and BMP as he increased his Losartan .    Dyslipidemia Assessment & Plan: Most recent lipid pane: Lab Results  Component Value Date   CHOL 204 (H) 05/23/2023   HDL 35 (L) 05/23/2023   LDLCALC 151 (H) 05/23/2023   TRIG 99 05/23/2023   CHOLHDL 5.8 (H) 05/23/2023   Condition managed by cardiology. On Crestor  with reported good compliance and tolerance. LDL is not at goal. Elevated LDL may be secondary to nutrition, genetics and spillover effect from excess adiposity. Recommended LDL goal is <70 to reduce the risk of fatty streaks and the progression to obstructive ASCVD in the future. Pt advised to reduce saturated and trans fats in diet.  HDL is also too low. Discussed that increasing exercise  can raise HDL. Cardiology will be rechecking his FLP next week.    Prediabetes Assessment & Plan: Most recent A1c and insulin : Lab Results  Component Value Date   HGBA1C 6.0 (H) 05/23/2023   INSULIN  11.0 05/23/2023    On no medications. His hunger and cravings are controlled when adhering to his category 3 eating plan. Reminded pt about role  of simple carbs and insulin  levels on hunger and cravings. Continue with reduced calorie meal plan low on processed crabs and simple sugars. Ongoing weight loss will improve insulin  resistance, A1c, and glycemic control.   Vitamin D  deficiency Assessment & Plan: Most recent Vitamin D : Lab Results  Component Value Date   VD25OH 36.3 05/23/2023   Continue ERGO 50,000 units once weekly and weight loss efforts. Recheck levels in 1 mo or so.    Follow up:   No follow-ups on file. He was informed of the importance of frequent follow up visits to maximize his success with intensive lifestyle modifications for his multiple health conditions.  Subjective:   Chief complaint: Obesity Randall Smith is here to discuss his progress with his obesity treatment plan. He is on  the Category 3 Plan with 100 snack calories and states he is following his eating plan approximately 80% of the time. He states he is not engaging in any regular exercise.   Interval History:  Randall Smith is here for a follow up office visit. Since last OV on 06/24/2023, Randall Smith is down 4 lbs. Ate off plan during Easter.  Is drinking 40 ounces of water daily.    States he coul do pebetter hwith protein intake    Pharmacotherapy for weight loss: He is currently taking no anti-obesity medication.   Review of Systems:  Pertinent positives were addressed with patient today.Reviewed by clinician on day of visit: allergies, medications, problem list, medical history, surgical history, family history, social history, and previous encounter notes.  Weight Summary and Biometrics   Weight Lost Since Last Visit: 4lb  Weight Gained Since Last Visit: 0lb   Vitals Temp: 97.8 F (36.6 C) BP: 136/63 Pulse Rate: 69 SpO2: 98 %   Anthropometric Measurements Height: 5\' 11"  (1.803 m) Weight: 239 lb (108.4 kg) BMI (Calculated): 33.35 Weight at Last Visit: 243lb Weight Lost Since Last Visit: 4lb Weight Gained Since Last Visit:  0lb Starting Weight: 253lb Total Weight Loss (lbs): 15 lb (6.804 kg) Peak Weight: 266lb   Body Composition  Body Fat %: 33.4 % Fat Mass (lbs): 80 lbs Muscle Mass (lbs): 151.8 lbs Total Body Water (lbs): 111.6 lbs Visceral Fat Rating : 18   Other Clinical Data Fasting: No Labs: No Today's Visit #: 4 Starting Date: 05/23/23   Objective:   PHYSICAL EXAM: Blood pressure 136/63, pulse 69, temperature 97.8 F (36.6 C), height 5\' 11"  (1.803 m), weight 239 lb (108.4 kg), SpO2 98%. Body mass index is 33.33 kg/m.  General: he is overweight, cooperative and in no acute distress. PSYCH: Has normal mood, affect and thought process.   HEENT: EOMI, sclerae are anicteric. Lungs: Normal breathing effort, no conversational dyspnea. Extremities: Moves * 4 Neurologic: A and O * 3, good insight  DIAGNOSTIC DATA REVIEWED: BMET    Component Value Date/Time   NA 141 06/06/2023 0919   K 4.7 06/06/2023 0919   CL 103 06/06/2023 0919   CO2 22 06/06/2023 0919   GLUCOSE 120 (H) 06/06/2023 0919   GLUCOSE 137 (H) 12/08/2011 0910   BUN 17 06/06/2023 0919   CREATININE 1.09  06/06/2023 0919   CALCIUM  9.1 06/06/2023 0919   GFRNONAA 68 (L) 12/08/2011 0910   GFRAA 79 (L) 12/08/2011 0910   Lab Results  Component Value Date   HGBA1C 6.0 (H) 05/23/2023   Lab Results  Component Value Date   INSULIN  11.0 05/23/2023   Lab Results  Component Value Date   TSH 1.590 05/23/2023   CBC    Component Value Date/Time   WBC 6.5 05/23/2023 1123   WBC 11.6 (H) 12/08/2011 0910   RBC 4.93 05/23/2023 1123   RBC 5.01 12/08/2011 0910   HGB 15.4 05/23/2023 1123   HCT 45.8 05/23/2023 1123   PLT 214 05/23/2023 1123   MCV 93 05/23/2023 1123   MCH 31.2 05/23/2023 1123   MCH 31.7 12/08/2011 0910   MCHC 33.6 05/23/2023 1123   MCHC 36.1 (H) 12/08/2011 0910   RDW 12.7 05/23/2023 1123   Iron Studies No results found for: "IRON", "TIBC", "FERRITIN", "IRONPCTSAT" Lipid Panel     Component Value Date/Time    CHOL 204 (H) 05/23/2023 1123   TRIG 99 05/23/2023 1123   HDL 35 (L) 05/23/2023 1123   CHOLHDL 5.8 (H) 05/23/2023 1123   LDLCALC 151 (H) 05/23/2023 1123   Hepatic Function Panel     Component Value Date/Time   PROT 7.6 05/23/2023 1123   ALBUMIN 4.7 05/23/2023 1123   AST 26 05/23/2023 1123   ALT 43 05/23/2023 1123   ALKPHOS 70 05/23/2023 1123   BILITOT 0.4 05/23/2023 1123      Component Value Date/Time   TSH 1.590 05/23/2023 1123   Nutritional Lab Results  Component Value Date   VD25OH 36.3 05/23/2023    Attestations:   I, Special Puri, acting as a Stage manager for [employer], DO., have compiled all relevant documentation for today's office visit on behalf of Marceil Sensor, DO, while in the presence of [employer], DO.  I have spent X minutes in the care of the patient today including: preparing to see patient (e.g. review and interpretation of tests, old notes ), obtaining and/or reviewing separately obtained history, performing a medically appropriate examination or evaluation, counseling and educating the patient, ordering medications, test or procedures, documenting clinical information in the electronic or other health care record, and independently interpreting results and communicating results to the patient, family, or caregiver   I have reviewed the above documentation for accuracy and completeness, and I agree with the above. Randall Smith, D.O.  The 21st Century Cures Act was signed into law in 2016 which includes the topic of electronic health records.  This provides immediate access to information in MyChart.  This includes consultation notes, operative notes, office notes, lab results and pathology reports.  If you have any questions about what you read please let us  know at your next visit so we can discuss your concerns and take corrective action if need be.  We are right here with you.

## 2023-07-18 LAB — COMPREHENSIVE METABOLIC PANEL WITH GFR
ALT: 24 IU/L (ref 0–44)
AST: 20 IU/L (ref 0–40)
Albumin: 4.3 g/dL (ref 3.8–4.9)
Alkaline Phosphatase: 61 IU/L (ref 44–121)
BUN/Creatinine Ratio: 17 (ref 9–20)
BUN: 18 mg/dL (ref 6–24)
Bilirubin Total: 0.5 mg/dL (ref 0.0–1.2)
CO2: 22 mmol/L (ref 20–29)
Calcium: 9.2 mg/dL (ref 8.7–10.2)
Chloride: 105 mmol/L (ref 96–106)
Creatinine, Ser: 1.08 mg/dL (ref 0.76–1.27)
Globulin, Total: 2.6 g/dL (ref 1.5–4.5)
Glucose: 103 mg/dL — ABNORMAL HIGH (ref 70–99)
Potassium: 4.4 mmol/L (ref 3.5–5.2)
Sodium: 143 mmol/L (ref 134–144)
Total Protein: 6.9 g/dL (ref 6.0–8.5)
eGFR: 81 mL/min/{1.73_m2} (ref >59)

## 2023-07-18 LAB — LIPID PANEL
Chol/HDL Ratio: 3.1 ratio (ref 0.0–5.0)
Cholesterol, Total: 98 mg/dL — ABNORMAL LOW (ref 100–199)
HDL: 32 mg/dL — ABNORMAL LOW (ref >39)
LDL Chol Calc (NIH): 46 mg/dL (ref 0–99)
Triglycerides: 104 mg/dL (ref 0–149)
VLDL Cholesterol Cal: 20 mg/dL (ref 5–40)

## 2023-07-23 ENCOUNTER — Ambulatory Visit: Payer: Self-pay

## 2023-07-30 ENCOUNTER — Ambulatory Visit (INDEPENDENT_AMBULATORY_CARE_PROVIDER_SITE_OTHER): Admitting: Family Medicine

## 2023-07-30 ENCOUNTER — Encounter (INDEPENDENT_AMBULATORY_CARE_PROVIDER_SITE_OTHER): Payer: Self-pay | Admitting: Family Medicine

## 2023-07-30 VITALS — BP 128/73 | HR 102 | Temp 98.7°F | Ht 71.0 in | Wt 237.0 lb

## 2023-07-30 DIAGNOSIS — E669 Obesity, unspecified: Secondary | ICD-10-CM

## 2023-07-30 DIAGNOSIS — E785 Hyperlipidemia, unspecified: Secondary | ICD-10-CM | POA: Diagnosis not present

## 2023-07-30 DIAGNOSIS — E65 Localized adiposity: Secondary | ICD-10-CM

## 2023-07-30 DIAGNOSIS — I10 Essential (primary) hypertension: Secondary | ICD-10-CM | POA: Diagnosis not present

## 2023-07-30 DIAGNOSIS — R7303 Prediabetes: Secondary | ICD-10-CM

## 2023-07-30 DIAGNOSIS — Z6835 Body mass index (BMI) 35.0-35.9, adult: Secondary | ICD-10-CM

## 2023-07-30 DIAGNOSIS — Z6833 Body mass index (BMI) 33.0-33.9, adult: Secondary | ICD-10-CM

## 2023-07-30 NOTE — Progress Notes (Signed)
 Randall Smith, D.O.  ABFM, ABOM Specializing in Clinical Bariatric Medicine  Office located at: [address] [place], [place]  [address]   Assessment and Plan:   Next OV: Recheck A1c, Vitamin D , and B12  FOR THE DISEASE OF OBESITY:  BMI 35.0-35.9,adult - current BMI 33.07 Obesity, Beginning BMI 33.9 Assessment & Plan: Since last office visit on 07/11/2023 patient's  Muscle mass has increased by 1.4 lb. Fat mass has decreased by 3.4 lb. Total body water has decreased by 1.6 lb.  Counseling done on how various foods will affect these numbers and how to maximize success  Total lbs lost to date: 16 lbs  Total weight loss percentage to date: 6.32%    Recommended Dietary Goals [name] is currently in the action stage of change. As such, his goal is to continue weight management plan.  He has agreed to: continue current plan   Behavioral Intervention We discussed the following today: increasing lean protein intake to established goals, increasing water intake, effects of caffeine on the body.   Additional resources provided today: Handout on Common Characteristics of Successful Weight Losers,  Evidence-based interventions for health behavior change were utilized today including the discussion of self monitoring techniques, problem-solving barriers and SMART goal setting techniques.   Regarding patient's less desirable eating habits and patterns, we employed the technique of small changes.   Pt will specifically work on measuring his lean protein intake and drinking 120+ ounces of water daily.   Recommended Physical Activity Goals [name] has been advised to work up to 300-450 minutes of moderate intensity aerobic activity a week and strengthening exercises 2-3 times per week for cardiovascular health, weight loss maintenance and preservation of muscle mass.   He has agreed to walk 30 minutes, 3 days a week.    Pharmacotherapy We both agreed to continue with nutritional and  behavioral strategies   ASSOCIATED CONDITIONS ADDRESSED TODAY:  Essential hypertension Assessment & Plan: Last 3 blood pressure readings in our office are as follows: BP Readings from Last 3 Encounters:  07/30/23 128/73  07/11/23 136/63  07/10/23 (!) 138/90   The ASCVD Risk score (Arnett DK, et al., 2019) failed to calculate for the following reasons:   The valid total cholesterol range is 130 to 320 mg/dL  Lab Results  Component Value Date   CREATININE 1.08 07/17/2023   Lab Results  Component Value Date   CREATININE 1.08 07/17/2023   BUN 18 07/17/2023   NA 143 07/17/2023   K 4.4 07/17/2023   CL 105 07/17/2023   CO2 22 07/17/2023   H/o Paroxysmal atrial fibrillation; on Tambocor  and Metoprolol . On 07/10/2023, Dr.Krasowski increased his Losartan  to 100 mg daily to improve BP control. BP is at goal today. Renal parameters are within acceptable ranges. Maintain with all medications. Continue low sodium diet. Encouraged adequate hydration.    Dyslipidemia Assessment & Plan: Most recent labs: Lab Results  Component Value Date   CHOL 98 (L) 07/17/2023   HDL 32 (L) 07/17/2023   LDLCALC 46 07/17/2023   TRIG 104 07/17/2023   CHOLHDL 3.1 07/17/2023      Component Value Date/Time   PROT 6.9 07/17/2023 0810   ALBUMIN 4.3 07/17/2023 0810   AST 20 07/17/2023 0810   ALT 24 07/17/2023 0810   ALKPHOS 61 07/17/2023 0810   BILITOT 0.5 07/17/2023 0810    On Crestor  with reported good compliance and tolerance.   LDL is at goal. HDL is too low. Continue statin therapy &  low cholesterol heart healthy meal plan. Hadrian has been advised to work up to 300-450 minutes of moderate intensity aerobic activity a week and strengthening exercises 2-3 times per week for cardiovascular health, weight loss maintenance and preservation of muscle mass.    Prediabetes Assessment & Plan: Lab Results  Component Value Date   HGBA1C 6.0 (H) 05/23/2023   INSULIN  11.0 05/23/2023    Diet/lifestyle  approach. His hunger and cravings are controlled when adhering to his meal plan. Continue balanced diet focusing on protein, fruits, and vegetables while limiting simple carbohydrates. Losing 10% or more of body weight may improve condition. Recheck A1c next OV.    Visceral obesity Assessment & Plan: Current visceral fat rating: 17.  The visceral fat rating should be  < 10 in a male.    Visceral adipose tissue is a hormonally active component of total body fat. This body composition phenotype is associated with medical disorders such as metabolic syndrome, cardiovascular disease and several malignancies including prostate, breast, and colorectal cancers.  Continue efforts to lose 7-10% of weight via prudent nutritional plan and lifestyle changes.     Follow up:   Return 09/02/2023 at 8:20 AM. He was informed of the importance of frequent follow up visits to maximize his success with intensive lifestyle modifications for his multiple health conditions.  Subjective:   Chief complaint: Obesity [name] is here to discuss his progress with his obesity treatment plan. He is on  the Category 3 Plan with B/L options and only 100 snack calories and states he is following his eating plan approximately 90% of the time. He states he is walking 30 minutes 1 day per week.  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 Since last OV on 07/11/2023, [name] is down 2 lbs. Not measuring his protein intake; does not feel he really increased his protein intake compared to LOV. Sometimes he deviates from his MP on the weekends. No c/o hunger and cravings today. Is drinking 80 ounces of fluids daily.   Pharmacotherapy for weight loss: n/a  Review of Systems:  Pertinent positives were addressed with patient today.  Reviewed by clinician on day of visit: allergies, medications, problem list, medical history, surgical history, family history, social history, and previous encounter  notes.  Weight Summary and Biometrics   Weight Lost Since Last Visit: 2lb  No data recorded   Vitals Temp: 98.7 F (37.1 C) BP: 128/73 Pulse Rate: (!) 102 SpO2: 98 %   Anthropometric Measurements Height: 5\' 11"  (1.803 m) Weight: 237 lb (107.5 kg) BMI (Calculated): 33.07 Weight at Last Visit: 2398lb Weight Lost Since Last Visit: 2lb Starting Weight: 253lb Total Weight Loss (lbs): 17 lb (7.711 kg) Peak Weight: 266lb   Body Composition  Body Fat %: 32.2 % Fat Mass (lbs): 76.6 lbs Muscle Mass (lbs): 153.21 lbs Total Body Water (lbs): 110 lbs Visceral Fat Rating : 17   Other Clinical Data Fasting: No Labs: No Today's Visit #: 5 Starting Date: 05/23/23   Objective:   PHYSICAL EXAM: Blood pressure 128/73, pulse (!) 102, temperature 98.7 F (37.1 C), height 5\' 11"  (1.803 m), weight 237 lb (107.5 kg), SpO2 98%. Body mass index is 33.05 kg/m.  General: he is overweight, cooperative and in no acute distress. PSYCH: Has normal mood, affect and thought process.   HEENT: EOMI, sclerae are anicteric. Lungs: Normal breathing effort, no conversational dyspnea. Extremities: Moves * 4 Neurologic: A and O * 3, good insight  DIAGNOSTIC  DATA REVIEWED: BMET    Component Value Date/Time   NA 143 07/17/2023 0810   K 4.4 07/17/2023 0810   CL 105 07/17/2023 0810   CO2 22 07/17/2023 0810   GLUCOSE 103 (H) 07/17/2023 0810   GLUCOSE 137 (H) 12/08/2011 0910   BUN 18 07/17/2023 0810   CREATININE 1.08 07/17/2023 0810   CALCIUM  9.2 07/17/2023 0810   GFRNONAA 68 (L) 12/08/2011 0910   GFRAA 79 (L) 12/08/2011 0910   Lab Results  Component Value Date   HGBA1C 6.0 (H) 05/23/2023   Lab Results  Component Value Date   INSULIN  11.0 05/23/2023   Lab Results  Component Value Date   TSH 1.590 05/23/2023   CBC    Component Value Date/Time   WBC 6.5 05/23/2023 1123   WBC 11.6 (H) 12/08/2011 0910   RBC 4.93 05/23/2023 1123   RBC 5.01 12/08/2011 0910   HGB 15.4 05/23/2023  1123   HCT 45.8 05/23/2023 1123   PLT 214 05/23/2023 1123   MCV 93 05/23/2023 1123   MCH 31.2 05/23/2023 1123   MCH 31.7 12/08/2011 0910   MCHC 33.6 05/23/2023 1123   MCHC 36.1 (H) 12/08/2011 0910   RDW 12.7 05/23/2023 1123   Iron Studies No results found for: "IRON", "TIBC", "FERRITIN", "IRONPCTSAT" Lipid Panel     Component Value Date/Time   CHOL 98 (L) 07/17/2023 0810   TRIG 104 07/17/2023 0810   HDL 32 (L) 07/17/2023 0810   CHOLHDL 3.1 07/17/2023 0810   LDLCALC 46 07/17/2023 0810   Hepatic Function Panel     Component Value Date/Time   PROT 6.9 07/17/2023 0810   ALBUMIN 4.3 07/17/2023 0810   AST 20 07/17/2023 0810   ALT 24 07/17/2023 0810   ALKPHOS 61 07/17/2023 0810   BILITOT 0.5 07/17/2023 0810      Component Value Date/Time   TSH 1.590 05/23/2023 1123   Nutritional Lab Results  Component Value Date   VD25OH 36.3 05/23/2023    Attestations:   I, Special Puri , acting as a Stage manager for [employer], DO., have compiled all relevant documentation for today's office visit on behalf of Marceil Sensor, DO, while in the presence of [employer], DO.  I have reviewed the above documentation for accuracy and completeness, and I agree with the above. Randall Smith, D.O.  The 21st Century Cures Act was signed into law in 2016 which includes the topic of electronic health records.  This provides immediate access to information in MyChart.  This includes consultation notes, operative notes, office notes, lab results and pathology reports.  If you have any questions about what you read please let us  know at your next visit so we can discuss your concerns and take corrective action if need be.  We are right here with you.

## 2023-07-30 NOTE — Progress Notes (Signed)
 Cardiology Office Note Date:  07/30/2023  Patient [name] 11-23-1965, MRN [id number] PCP:  Suzzanne Estrin, MD  Electrophysiologist: Dr Dyane Glance    Chief Complaint:   6 mo  History of Present Illness: Randall Smith is a 58 y.o. male with history of GERD, HTN, Afib.  He comes in today to be seen for Dr. Lawana Pray, last seen by him Sept 2021, at that time doing well, though c/o some arthralgia type complaints, perhaps the metoprolol  and changed to dilt.  Maintained in daily dose of flecainide .  She saw A. Denette Finner March 2022, doing well with some gas discomfort that resolved with belching Not on OAC with risk score of one  I saw him in 2023 He feels quite well He can tell when he is having Afib, feels "off" not as well as usual, and feels like he has had only a couple brief episodes. Feels quite well on his current dosing regime. 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 No CP, SOB, DOE No near syncope or syncope. Occassionally notes some swelling lower legs/feet He just saw his PMD for annual physical, BP, labs all were good Stable intervals  Seeing Drs. Camnitz/Krasowski regularly   Dr. Lawana Pray last 03/05/23, mentioned an episode of CP/SOB while driving, not on OAC given low risk score.  Dr. 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 Flecainide  started July 2017  Past Medical History:   Diagnosis Date   A-fib [facility])    Back pain    Chest pain    Chest pain    Dizzy spells    Elevated glucose    GERD (gastroesophageal reflux disease)    GERD (gastroesophageal reflux disease)    High blood pressure    Joint pain    Kidney stone    Low HDL (under 40)    Lung nodule    Overweight    Palpitations    Palpitations    Racing heart beat    SOB (shortness of breath)    Swelling of lower extremity     Past Surgical History:  Procedure Laterality Date   KNEE SURGERY Left 1996    Current Outpatient Medications  Medication Sig Dispense Refill   aspirin  EC 81 MG tablet Take 1 tablet (81 mg total) by mouth daily. Swallow whole.     cyanocobalamin  (VITAMIN B12) 500 MCG tablet Take 1 tablet (500 mcg total) by mouth daily. In form of MVI or B     flecainide  (TAMBOCOR ) 100 MG tablet Take 1 tablet (100 mg total) by mouth daily. 90 tablet 2   losartan  (COZAAR ) 100 MG tablet Take 1 tablet (100 mg total) by mouth daily. 90 tablet 3   metoprolol  succinate (TOPROL -XL) 25 MG 24 hr tablet Take 1 tablet (25 mg total) by mouth daily. 90 tablet 2   rosuvastatin  (CRESTOR ) 10 MG tablet Take 1 tablet (10 mg total) by mouth  daily. 90 tablet 3   Vitamin D , Ergocalciferol , (DRISDOL ) 1.25 MG (50000 UNIT) CAPS capsule Take 1 capsule (50,000 Units total) by mouth every 7 (seven) days. 4 capsule 1   No current facility-administered medications for this visit.    Allergies:   Patient has no known allergies.   Social History:  The patient  reports that he has never smoked. He has never used smokeless tobacco. He reports that he does not drink alcohol and does not use drugs.   Family History:  The patient's family history includes CAD in his maternal aunt; CVA in his mother; Cancer in his father; Heart attack in his father; Heart attack (age of onset: 51) in his maternal grandfather; Heart disease in his father, maternal grandmother, mother, and paternal grandmother; Hyperlipidemia in his mother;  Hypertension in his mother; Kidney disease in his mother; Stroke in his mother.  ROS:  Please see the history of present illness.    All other systems are reviewed and otherwise negative.   PHYSICAL EXAM:  VS:  There were no vitals taken for this visit. BMI: There is no height or weight on file to calculate BMI. Well nourished, well developed, in no acute distress HEENT: normocephalic, atraumatic Neck: no JVD, carotid bruits or masses Cardiac:  RRR; no significant murmurs, no rubs, or gallops Lungs: CTA b/l, no wheezing, rhonchi or rales Abd: soft, nontender MS: no deformity or atrophy Ext: trace if any edema noted today Skin: warm and dry, no rash Neuro:  No gross deficits appreciated Psych: euthymic mood, full affect    EKG:  Done today and reviewed by myself shows  SR 60bpm, PR , QRS 84ms, QTc  (2023) SB 58bpm, PR , QRS 88ms, Qtc  10/20/2015: stress myoview  Nuclear stress EF: 61%. There was no ST segment deviation noted during stress. No T wave inversion was noted during stress. This is a low risk study. The left ventricular ejection fraction is normal (55-65%).   07/14/2015: TTE Study Conclusions  - Left ventricle: The cavity size was normal. Wall thickness was    normal. Systolic function was normal. The estimated ejection    fraction was in the range of 60% to 65%. Wall motion was normal;    there were no regional wall motion abnormalities. Left    ventricular diastolic function parameters were normal.  - Left atrium: The atrium was mildly dilated.   Recent Labs: 05/23/2023: Hemoglobin 15.4; Platelets 214; TSH 1.590 07/17/2023: ALT 24; BUN 18; Creatinine, Ser 1.08; Potassium 4.4; Sodium 143  07/17/2023: Chol/HDL Ratio 3.1; Cholesterol, Total 98; HDL 32; LDL Chol Calc (NIH) 46; Triglycerides 104   Estimated Creatinine Clearance: 95.3 mL/min (by C-G formula based on SCr of 1.08 mg/dL).   Wt Readings from Last 3 Encounters:  07/30/23 237 lb (107.5  kg)  07/11/23 239 lb (108.4 kg)  07/10/23 244 lb 9.6 oz (110.9 kg)     Other studies reviewed: Additional studies/records reviewed today include: summarized above  ASSESSMENT AND PLAN:  Paroxysmal Afib CHA2DS2Vasc remains one  flecainide  and Toprol  Stable intervals no burden by symptoms  2.   HTN Looks good    Disposition: back in 66mo again, sooner if needed  Current medicines are reviewed at length with the patient today.  The patient did not have any concerns regarding medicines.  [place], PA-C 07/30/2023 12:32 PM     [facility] [address] [place] [address] [phone] (office)  [phone] (fax)

## 2023-08-01 ENCOUNTER — Ambulatory Visit: Payer: 59 | Attending: Physician Assistant | Admitting: Physician Assistant

## 2023-08-01 ENCOUNTER — Encounter: Payer: Self-pay | Admitting: Physician Assistant

## 2023-08-01 VITALS — BP 126/88 | HR 60 | Ht 71.0 in | Wt 242.0 lb

## 2023-08-01 DIAGNOSIS — I10 Essential (primary) hypertension: Secondary | ICD-10-CM

## 2023-08-01 DIAGNOSIS — Z79899 Other long term (current) drug therapy: Secondary | ICD-10-CM

## 2023-08-01 DIAGNOSIS — I480 Paroxysmal atrial fibrillation: Secondary | ICD-10-CM | POA: Diagnosis not present

## 2023-08-01 DIAGNOSIS — Z5181 Encounter for therapeutic drug level monitoring: Secondary | ICD-10-CM | POA: Diagnosis not present

## 2023-08-01 NOTE — Patient Instructions (Signed)
 Medication Instructions:   Your physician recommends that you continue on your current medications as directed. Please refer to the Current Medication list given to you today.  *If you need a refill on your cardiac medications before your next appointment, please call your pharmacy*  Lab Work:  NONE ORDERED  TODAY   If you have labs (blood work) drawn today and your tests are completely normal, you will receive your results only by: MyChart Message (if you have MyChart) OR A paper copy in the mail If you have any lab test that is abnormal or we need to change your treatment, we will call you to review the results.  Testing/Procedures: NONE ORDERED  TODAY    Follow-Up: At [facility], you and your health needs are our priority.  As part of our continuing mission to provide you with exceptional heart care, our providers are all part of one team.  This team includes your primary Cardiologist (physician) and Advanced Practice Providers or APPs (Physician Assistants and Nurse Practitioners) who all work together to provide you with the care you need, when you need it.  Your next appointment:    6 month(s)   Provider:   You may see Will Cortland Ding, MD or one of the following Advanced Practice Providers on your designated Care Team:   Mertha Abrahams, [place]    We recommend signing up for the patient portal called "MyChart".  Sign up information is provided on this After Visit Summary.  MyChart is used to connect with patients for Virtual Visits (Telemedicine).  Patients are able to view lab/test results, encounter notes, upcoming appointments, etc.  Non-urgent messages can be sent to your provider as well.   To learn more about what you can do with MyChart, go to [email or web address].   Other Instructions

## 2023-09-02 ENCOUNTER — Ambulatory Visit (INDEPENDENT_AMBULATORY_CARE_PROVIDER_SITE_OTHER): Admitting: Family Medicine

## 2023-09-02 VITALS — BP 144/87 | HR 68 | Temp 97.9°F | Ht 71.0 in | Wt 234.0 lb

## 2023-09-02 DIAGNOSIS — I10 Essential (primary) hypertension: Secondary | ICD-10-CM

## 2023-09-02 DIAGNOSIS — E538 Deficiency of other specified B group vitamins: Secondary | ICD-10-CM | POA: Diagnosis not present

## 2023-09-02 DIAGNOSIS — R7303 Prediabetes: Secondary | ICD-10-CM | POA: Diagnosis not present

## 2023-09-02 DIAGNOSIS — Z6832 Body mass index (BMI) 32.0-32.9, adult: Secondary | ICD-10-CM

## 2023-09-02 DIAGNOSIS — F4320 Adjustment disorder, unspecified: Secondary | ICD-10-CM | POA: Diagnosis not present

## 2023-09-02 DIAGNOSIS — E669 Obesity, unspecified: Secondary | ICD-10-CM

## 2023-09-02 DIAGNOSIS — E559 Vitamin D deficiency, unspecified: Secondary | ICD-10-CM

## 2023-09-02 MED ORDER — VITAMIN D (ERGOCALCIFEROL) 1.25 MG (50000 UNIT) PO CAPS: 50000.0000 [IU] | ORAL_CAPSULE | ORAL | 0 refills | Status: DC

## 2023-09-02 NOTE — Progress Notes (Signed)
 Randall Smith, D.O.  ABFM, ABOM Specializing in Clinical Bariatric Medicine  Office located at: [address] [place], [place]  [address]   Assessment and Plan:   Orders Placed This Encounter  Procedures   Hemoglobin A1c   VITAMIN D  25 Hydroxy (Vit-D Deficiency, Fractures)   Vitamin B12    Medications Discontinued During This Encounter  Medication Reason   Vitamin D , Ergocalciferol , (DRISDOL ) 1.25 MG (50000 UNIT) CAPS capsule Reorder     Meds ordered this encounter  Medications   Vitamin D , Ergocalciferol , (DRISDOL ) 1.25 MG (50000 UNIT) CAPS capsule    Sig: Take 1 capsule (50,000 Units total) by mouth every 7 (seven) days.    Dispense:  4 capsule    Refill:  0     FOR THE DISEASE OF OBESITY:  BMI 32.0-32.9,adult Obesity, Beginning BMI 33.9 Assessment & Plan: Since last office visit on 07/30/2023 patient's  Muscle mass has increased by 0.79 lb. Fat mass has decreased by 4.6 lb. Total body water has decreased by 0.4 lb.  Counseling done on how various foods will affect these numbers and how to maximize success  Total lbs lost to date: 19 lbs  Total weight loss percentage to date: 7.51%    Recommended Dietary Goals Randall Smith is currently in the action stage of change. As such, his goal is to continue weight management plan.  Randall Smith has agreed to: continue current plan   Behavioral Intervention We discussed the following today: increasing lean protein intake to established goals, increasing water intake , and work on managing stress.  Additional resources provided today: Handout on benefits of 5-10% weight loss  Evidence-based interventions for health behavior change were utilized today including the discussion of self monitoring techniques, problem-solving barriers and SMART goal setting techniques.   Regarding patient's less desirable eating habits and patterns, we employed the technique of small changes.   Pt will specifically work on: n/a   Recommended  Physical Activity Goals Randall Smith has been advised to work up to 300-450 minutes of moderate intensity aerobic activity a week and strengthening exercises 2-3 times per week for cardiovascular health, weight loss maintenance and preservation of muscle mass.   Randall Smith has agreed to think about enjoyable ways to increase daily physical activity (e.g HIT exercise videos or Walk at Home series videos) to improve HDL and mood.    Pharmacotherapy We both agreed to : Continue with current nutritional and behavioral strategies   ASSOCIATED CONDITIONS ADDRESSED TODAY:   Essential hypertension Assessment & Plan: Last 3 blood pressure readings in our office are as follows: BP Readings from Last 3 Encounters:  09/02/23 (!) 144/87  08/01/23 126/88  07/30/23 128/73   The ASCVD Risk score (Arnett DK, et al., 2019) failed to calculate for the following reasons:   The valid total cholesterol range is 130 to 320 mg/dL  Lab Results  Component Value Date   CREATININE 1.08 07/17/2023   H/o Paroxysmal atrial fibrillation. Relevant medications: Losartan  100 mg daily, Metoprolol  succinate 25 mg daily, and Flecainide  100 mg daily. BP is above target today. This could be secondary to the polish sausage, hot dogs, and hamburgers pt consumed yesterday, the distress of his mothers death, and the shoulder pain Randall Smith's experiencing.  Randall Smith is not checking his BP at home.   For the shoulder pain, I recommend Randall Smith ice it 15-20 minutes every hour. If pain lasts more than 2 weeks or worsens, Randall Smith was advised to see a shoulder specialist. Pt instructed to check  his BP every other day. If BP is above target ( 140/90 or greater on a regular basis), I recommend Randall Smith contact his cardiology team. Continue medications and low sodium diet. Encouraged to start formal exercise.    Grief reaction- loss of mom Assessment & Plan: Mr.Godbolt's mother died 2 weeks at 85 y.o. Randall Smith thinks from congestive heart failure issues. States Randall Smith's not sleeping well  and thinks it could be from shoulder pain + the distress of his mother's death.   Explained to patient the various stages of grief and recommend him to allow himself to feel/experience them. Also encouraged exercise to improve mood. Reminded patient of the importance of following their prudent nutrition plan and how food can affect mood as well to support emotional wellbeing.     Prediabetes Assessment & Plan: Lab Results  Component Value Date   HGBA1C 6.0 (H) 05/23/2023   INSULIN  11.0 05/23/2023    Managed with diet and life style interventions. States that his hunger and cravings are overall well controlled when following his prudent nutritional plan.   Continue working on nutrition plan to decrease simple carbohydrates, increase lean proteins and exercise to promote weight loss and improve glycemic control and prevent progression to T2DM. Recheck labs.     B12 insufficiency Assessment & Plan: Lab Results  Component Value Date   VITAMINB12 428 05/23/2023   Doing well on OTC B12 500 mcg daily. Continue regimen; recheck levels today.     Vitamin D  deficiency Assessment & Plan: Lab Results  Component Value Date   VD25OH 36.3 05/23/2023   Doing well on ergocalciferol  50,000 units wkly. Continue regimen; recheck levels today.     Follow up:   Return 10/02/2023 at 8:20 AM. Randall Smith was informed of the importance of frequent follow up visits to maximize his success with intensive lifestyle modifications for his multiple health conditions.  Randall Smith is aware that we will review all of his lab results at our next visit together in person.  Randall Smith is aware that if anything is critical/ life threatening with the results, we will be contacting him via MyChart or by my CMA will be calling them prior to the office visit to discuss acute management.      Subjective:   Chief complaint: Obesity Randall Smith is here to discuss his progress with his obesity treatment plan.  Randall Smith is on  the Category 3  Plan with B/L options and only 100 snack calories and states Randall Smith is following his eating plan approximately 90% of the time. Randall Smith states Randall Smith is walking a bit.    Interval History:  Randall Smith is here for a follow up office visit. Since last OV on 07/30/2023, Randall Smith is down 3 lbs. Has been meal planning at night although Randall Smith notes it has been more difficult to do because Randall Smith's been more busier at work. Eats out on the weekends with family; is trying to make the healthiest choices when ordering foods for the most part. Of note, Randall Smith's mother died 2 weeks at 26 y.o. Randall Smith thinks from congestive heart failure issues.   Pharmacotherapy for weight loss: none   Review of Systems:  Pertinent positives were addressed with patient today.  Reviewed by clinician on day of visit: allergies, medications, problem list, medical history, surgical history, family history, social history, and previous encounter notes.   Weight Summary and Biometrics   Weight Lost Since Last Visit: 3lb  Weight Gained Since Last Visit: 0lb    Vitals Temp: 97.9  F (36.6 C) BP: (!) 144/87 Pulse Rate: 68 SpO2: 100 %   Anthropometric Measurements Height: 5' 11 (1.803 m) Weight: 234 lb (106.1 kg) BMI (Calculated): 32.65 Weight at Last Visit: 237lb Weight Lost Since Last Visit: 3lb Weight Gained Since Last Visit: 0lb Starting Weight: 253lb Total Weight Loss (lbs): 20 lb (9.072 kg) Peak Weight: 266lb   Body Composition  Body Fat %: 30.8 % Fat Mass (lbs): 72 lbs Muscle Mass (lbs): 154 lbs Total Body Water (lbs): 109.6 lbs Visceral Fat Rating : 17   Other Clinical Data Fasting: Yes Labs: Yes Today's Visit #: 6 Starting Date: 05/23/23    Objective:   PHYSICAL EXAM: Blood pressure (!) 144/87, pulse 68, temperature 97.9 F (36.6 C), height 5' 11 (1.803 m), weight 234 lb (106.1 kg), SpO2 100%. Body mass index is 32.64 kg/m.  General: Randall Smith is overweight, cooperative and in no acute distress. PSYCH: Has  normal mood, affect and thought process.   HEENT: EOMI, sclerae are anicteric. Lungs: Normal breathing effort, no conversational dyspnea. Extremities: Moves * 4 Neurologic: A and O * 3, good insight  DIAGNOSTIC DATA REVIEWED: BMET    Component Value Date/Time   NA 143 07/17/2023 0810   K 4.4 07/17/2023 0810   CL 105 07/17/2023 0810   CO2 22 07/17/2023 0810   GLUCOSE 103 (H) 07/17/2023 0810   GLUCOSE 137 (H) 12/08/2011 0910   BUN 18 07/17/2023 0810   CREATININE 1.08 07/17/2023 0810   CALCIUM  9.2 07/17/2023 0810   GFRNONAA 68 (L) 12/08/2011 0910   GFRAA 79 (L) 12/08/2011 0910   Lab Results  Component Value Date   HGBA1C 6.0 (H) 05/23/2023   Lab Results  Component Value Date   INSULIN  11.0 05/23/2023   Lab Results  Component Value Date   TSH 1.590 05/23/2023   CBC    Component Value Date/Time   WBC 6.5 05/23/2023 1123   WBC 11.6 (H) 12/08/2011 0910   RBC 4.93 05/23/2023 1123   RBC 5.01 12/08/2011 0910   HGB 15.4 05/23/2023 1123   HCT 45.8 05/23/2023 1123   PLT 214 05/23/2023 1123   MCV 93 05/23/2023 1123   MCH 31.2 05/23/2023 1123   MCH 31.7 12/08/2011 0910   MCHC 33.6 05/23/2023 1123   MCHC 36.1 (H) 12/08/2011 0910   RDW 12.7 05/23/2023 1123   Iron Studies No results found for: IRON, TIBC, FERRITIN, IRONPCTSAT Lipid Panel     Component Value Date/Time   CHOL 98 (L) 07/17/2023 0810   TRIG 104 07/17/2023 0810   HDL 32 (L) 07/17/2023 0810   CHOLHDL 3.1 07/17/2023 0810   LDLCALC 46 07/17/2023 0810   Hepatic Function Panel     Component Value Date/Time   PROT 6.9 07/17/2023 0810   ALBUMIN 4.3 07/17/2023 0810   AST 20 07/17/2023 0810   ALT 24 07/17/2023 0810   ALKPHOS 61 07/17/2023 0810   BILITOT 0.5 07/17/2023 0810      Component Value Date/Time   TSH 1.590 05/23/2023 1123   Nutritional Lab Results  Component Value Date   VD25OH 36.3 05/23/2023    Attestations:   I, Special Puri , acting as a Stage manager for [employer], DO.,  have compiled all relevant documentation for today's office visit on behalf of Randall Jenkins, DO, while in the presence of [employer], DO.  I have spent 40 minutes in the care of the patient today including 32 minutes face-to-face assessing and reviewing listed medical problems above as outlined in office  visit note, providing nutritional and behavioral counseling as outlined in obesity care plan, and ordering blood work.  I have reviewed the above documentation for accuracy and completeness, and I agree with the above. Randall JINNY Smith, D.O.  The 21st Century Cures Act was signed into law in 2016 which includes the topic of electronic health records.  This provides immediate access to information in MyChart.  This includes consultation notes, operative notes, office notes, lab results and pathology reports.  If you have any questions about what you read please let us  know at your next visit so we can discuss your concerns and take corrective action if need be.  We are right here with you.

## 2023-09-03 LAB — HEMOGLOBIN A1C
Est. average glucose Bld gHb Est-mCnc: 123 mg/dL
Hgb A1c MFr Bld: 5.9 % — ABNORMAL HIGH (ref 4.8–5.6)

## 2023-09-03 LAB — VITAMIN D 25 HYDROXY (VIT D DEFICIENCY, FRACTURES): Vit D, 25-Hydroxy: 82.5 ng/mL (ref 30.0–100.0)

## 2023-09-03 LAB — VITAMIN B12: Vitamin B-12: 569 pg/mL (ref 232–1245)

## 2023-10-02 ENCOUNTER — Encounter (INDEPENDENT_AMBULATORY_CARE_PROVIDER_SITE_OTHER): Payer: Self-pay | Admitting: Family Medicine

## 2023-10-02 ENCOUNTER — Ambulatory Visit (INDEPENDENT_AMBULATORY_CARE_PROVIDER_SITE_OTHER): Admitting: Family Medicine

## 2023-10-02 VITALS — BP 125/79 | HR 69 | Temp 97.8°F | Ht 71.0 in | Wt 231.0 lb

## 2023-10-02 DIAGNOSIS — R7303 Prediabetes: Secondary | ICD-10-CM

## 2023-10-02 DIAGNOSIS — E669 Obesity, unspecified: Secondary | ICD-10-CM

## 2023-10-02 DIAGNOSIS — E559 Vitamin D deficiency, unspecified: Secondary | ICD-10-CM

## 2023-10-02 DIAGNOSIS — E538 Deficiency of other specified B group vitamins: Secondary | ICD-10-CM | POA: Diagnosis not present

## 2023-10-02 DIAGNOSIS — I10 Essential (primary) hypertension: Secondary | ICD-10-CM

## 2023-10-02 DIAGNOSIS — E785 Hyperlipidemia, unspecified: Secondary | ICD-10-CM

## 2023-10-02 DIAGNOSIS — Z6832 Body mass index (BMI) 32.0-32.9, adult: Secondary | ICD-10-CM

## 2023-10-02 MED ORDER — VITAMIN D (ERGOCALCIFEROL) 1.25 MG (50000 UNIT) PO CAPS: ORAL_CAPSULE | ORAL | 0 refills | Status: DC

## 2023-10-02 MED ORDER — CYANOCOBALAMIN 500 MCG PO TABS: 1000.0000 ug | ORAL_TABLET | Freq: Every day | ORAL | Status: DC

## 2023-10-02 NOTE — Progress Notes (Signed)
 Randall Smith, D.O.  ABFM, ABOM Specializing in Clinical Bariatric Medicine  Office located at: [address] [place], [place]  [address]   Assessment and Plan:   Medications Discontinued During This Encounter  Medication Reason   cyanocobalamin  (VITAMIN B12) 500 MCG tablet Reorder   Vitamin D , Ergocalciferol , (DRISDOL ) 1.25 MG (50000 UNIT) CAPS capsule Reorder    Meds ordered this encounter  Medications   Vitamin D , Ergocalciferol , (DRISDOL ) 1.25 MG (50000 UNIT) CAPS capsule    Sig: 1 po q 14 days    Dispense:  6 capsule    Refill:  0   cyanocobalamin  (VITAMIN B12) 500 MCG tablet    Sig: Take 2 tablets (1,000 mcg total) by mouth daily. In form of MVI or B       FOR THE DISEASE OF OBESITY: Obesity, Beginning BMI 33.9 BMI 32.0-32.9,adult current 32.22 Assessment & Plan: Since last office visit on 09/02/23 patient's muscle mass has decreased by 2.2 lbs. Fat mass has decreased by 0.6 lbs. Total body water has decreased by 2.8 lbs.  Counseling done on how various foods will affect these numbers and how to maximize success  Total lbs lost to date: 22 lbs Total weight loss percentage to date: -8.70 %   Recommended Dietary Goals Ihan is currently in the action stage of change. As such, his goal is to continue weight management plan.  He has agreed to: continue current plan   Behavioral Intervention We discussed the following today: increasing lean protein intake to established goals, decreasing simple carbohydrates , avoiding skipping meals, increasing water intake , decreasing eating out or consumption of processed foods, and making healthy choices when eating convenient foods, and practice mindfulness eating and understand the difference between hunger signals and cravings  Additional resources provided today: None  Evidence-based interventions for health behavior change were utilized today including the discussion of self monitoring techniques, problem-solving  barriers and SMART goal setting techniques.  Regarding patient's less desirable eating habits and patterns, we employed the technique of small changes.   Pt will specifically work on: n/a    Recommended Physical Activity Goals Dewain has been advised to work up to 300-450 minutes of moderate intensity aerobic activity a week and strengthening exercises 2-3 times per week for cardiovascular health, weight loss maintenance and preservation of muscle mass.   He has agreed to: [employer] 2-3x/week.    Pharmacotherapy We both agreed to: Continue with current nutritional and behavioral strategies   ASSOCIATED CONDITIONS ADDRESSED TODAY:  Essential hypertension Assessment & Plan: BP Readings from Last 3 Encounters:  10/02/23 125/79  09/02/23 (!) 144/87  08/01/23 126/88   BP at goal. Currently on Toprol  XL 25 mg once daily and Losartan  100 mg once daily. Also taking Flecainide  100 mg once daily for A-fib. Good compliance and tolerance reported. No adverse side effects reported. Pt is asx w no acute concerns.   Continue current antihypertensive regimen as prescribed. Work on low-salt and high protein nutritional meal plan. Will continue monitoring alongside PCP/cardiology care team.     Prediabetes Assessment & Plan: Lab Results  Component Value Date   HGBA1C 5.9 (H) 09/02/2023   HGBA1C 6.0 (H) 05/23/2023   INSULIN  11.0 05/23/2023    Not currently on pharmacologic treatment. Diet/lifestyle controlled. Reviewed last obtained LOV: A1c has improved to 5.9 since last checked. Pt states he also recently had labs with PCP, not currently on EHR.   Continue working on decreasing simple carbs and added sugars.  Focus on increasing protein intake to promote glycemic control and improve insulin  sensitivity. Pt advised to bring in a physical copy of his labs PCP obtained at last f/u with PCP. Continue monitoring.     Vitamin D  deficiency Assessment & Plan: Lab Results  Component  Value Date   VD25OH 82.5 09/02/2023   VD25OH 36.3 05/23/2023   Reviewed last obtained labs: vit D WNL. Pt compliant with ERGO 50K units once weekly. Good tolerance with no adverse SE. No acute concerns in this regard today.   Ideal vit D levels of 5-70 reviewed with pt. Discussed how vit D levels can improve with weight loss. Recommended pt take every 10 days, however, he felt every that would be difficult to remember, and he requested to take every 14 days instead. Will DECREASE ERGO to once every 14 days. All questions answered/addressed. Will continue monitoring and recheck vit D levels in approx. 3-4 months.     B12 insufficiency Assessment & Plan: Lab Results  Component Value Date   VITAMINB12 569 09/02/2023   Reviewed last obtained labs: vit B12 at goal. Pt taking B12 500 mcg once daily. Tolerating well with no SE. Reports improved energy levels.   Reviewed goal B12 goal of 500+ with patient. Shared decision making to INCREASE B12 to 1,000 mg once daily. All questions answered/addressed. Pt verbalized understanding/agreement. Will continue monitoring - recheck vit B12 in approx. 3-4 months.     Dyslipidemia Assessment & Plan: Lab Results  Component Value Date   CHOL 98 (L) 07/17/2023   HDL 32 (L) 07/17/2023   LDLCALC 46 07/17/2023   TRIG 104 07/17/2023   CHOLHDL 3.1 07/17/2023   Reviewed last obtained labs: HDL below goal at 32 and tot chol is 98. Other components are overall WNL. Currently on Rosuvastatin  10 mg once daily.   Continue to work on reducing consumption of fatty meats, saturated and trans fats, and simple carbs. Encouraged pt to increase lean protein intake. Increase exercise to improve HDL; add resistance training 2-3 days a week. Defer to cardiology on recommendations of medication changes. Continue current statin therapy as prescribed.     Follow up:   Return for follow up on 10/30/2023 at 8:20 AM. Make another 2 months afterwards if pt desires.  He was  informed of the importance of frequent follow up visits to maximize his success with intensive lifestyle modifications for his multiple health conditions.  Subjective:   Chief complaint: Obesity Ysmael is here to discuss his progress with his obesity treatment plan. He is on the Category 3 Plan with B/L options and only 100 snack calories and states he is following his eating plan approximately 90% of the time. He states he is not exercising.  Interval History:  NAZAIAH NAVARRETE is here for a follow up office visit. Since last OV on 09/02/23, he is down 3 lbs. He has recently struggled with eating dinner at an earlier time. He states he tends to eat dinner around 10 PM. For dinner last night, he ate chicken and cheese in a low carb tortilla. Doesn't crave sweets, but enjoys eating grapes.   Pharmacotherapy that aid with weight loss: He is currently taking no anti-obesity medication.   Review of Systems:  Pertinent positives were addressed with patient today.  Reviewed by clinician on day of visit: allergies, medications, problem list, medical history, surgical history, family history, social history, and previous encounter notes.  Weight Summary and Biometrics   Weight Lost Since Last Visit: 3lb  Weight Gained Since Last Visit: 0lb    Vitals Temp: 97.8 F (36.6 C) BP: 125/79 Pulse Rate: 69 SpO2: 98 %   Anthropometric Measurements Height: 5' 11 (1.803 m) Weight: 231 lb (104.8 kg) BMI (Calculated): 32.23 Weight at Last Visit: 234lb Weight Lost Since Last Visit: 3lb Weight Gained Since Last Visit: 0lb Starting Weight: 253lb Total Weight Loss (lbs): 22 lb (9.979 kg) Peak Weight: 266lb   Body Composition  Body Fat %: 30.9 % Fat Mass (lbs): 71.4 lbs Muscle Mass (lbs): 151.8 lbs Total Body Water (lbs): 106.8 lbs Visceral Fat Rating : 16   Other Clinical Data Fasting: No Labs: No Today's Visit #: 7 Starting Date: 05/23/23 Comments: Cat 3 B/L 100 snack    Objective:    PHYSICAL EXAM: Blood pressure 125/79, pulse 69, temperature 97.8 F (36.6 C), height 5' 11 (1.803 m), weight 231 lb (104.8 kg), SpO2 98%. Body mass index is 32.22 kg/m.  General: he is overweight, cooperative and in no acute distress. PSYCH: Has normal mood, affect and thought process.   HEENT: EOMI, sclerae are anicteric. Lungs: Normal breathing effort, no conversational dyspnea. Extremities: Moves * 4 Neurologic: A and O * 3, good insight  DIAGNOSTIC DATA REVIEWED: BMET    Component Value Date/Time   NA 143 07/17/2023 0810   K 4.4 07/17/2023 0810   CL 105 07/17/2023 0810   CO2 22 07/17/2023 0810   GLUCOSE 103 (H) 07/17/2023 0810   GLUCOSE 137 (H) 12/08/2011 0910   BUN 18 07/17/2023 0810   CREATININE 1.08 07/17/2023 0810   CALCIUM  9.2 07/17/2023 0810   GFRNONAA 68 (L) 12/08/2011 0910   GFRAA 79 (L) 12/08/2011 0910   Lab Results  Component Value Date   HGBA1C 5.9 (H) 09/02/2023   HGBA1C 6.0 (H) 05/23/2023   Lab Results  Component Value Date   INSULIN  11.0 05/23/2023   Lab Results  Component Value Date   TSH 1.590 05/23/2023   CBC    Component Value Date/Time   WBC 6.5 05/23/2023 1123   WBC 11.6 (H) 12/08/2011 0910   RBC 4.93 05/23/2023 1123   RBC 5.01 12/08/2011 0910   HGB 15.4 05/23/2023 1123   HCT 45.8 05/23/2023 1123   PLT 214 05/23/2023 1123   MCV 93 05/23/2023 1123   MCH 31.2 05/23/2023 1123   MCH 31.7 12/08/2011 0910   MCHC 33.6 05/23/2023 1123   MCHC 36.1 (H) 12/08/2011 0910   RDW 12.7 05/23/2023 1123   Iron Studies No results found for: IRON, TIBC, FERRITIN, IRONPCTSAT Lipid Panel     Component Value Date/Time   CHOL 98 (L) 07/17/2023 0810   TRIG 104 07/17/2023 0810   HDL 32 (L) 07/17/2023 0810   CHOLHDL 3.1 07/17/2023 0810   LDLCALC 46 07/17/2023 0810   Hepatic Function Panel     Component Value Date/Time   PROT 6.9 07/17/2023 0810   ALBUMIN 4.3 07/17/2023 0810   AST 20 07/17/2023 0810   ALT 24 07/17/2023 0810    ALKPHOS 61 07/17/2023 0810   BILITOT 0.5 07/17/2023 0810      Component Value Date/Time   TSH 1.590 05/23/2023 1123   Nutritional Lab Results  Component Value Date   VD25OH 82.5 09/02/2023   VD25OH 36.3 05/23/2023    Attestations:   I, Vernell Forest, acting as a Stage manager for Randall Jenkins, DO., have compiled all relevant documentation for today's office visit on behalf of Randall Jenkins, DO, while in the presence of [employer], DO.  I  have reviewed the above documentation for accuracy and completeness, and I agree with the above. Randall JINNY Smith, D.O.  The 21st Century Cures Act was signed into law in 2016 which includes the topic of electronic health records.  This provides immediate access to information in MyChart.  This includes consultation notes, operative notes, office notes, lab results and pathology reports.  If you have any questions about what you read please let us  know at your next visit so we can discuss your concerns and take corrective action if need be.  We are right here with you.

## 2023-10-25 DIAGNOSIS — E663 Overweight: Secondary | ICD-10-CM | POA: Insufficient documentation

## 2023-10-25 DIAGNOSIS — K219 Gastro-esophageal reflux disease without esophagitis: Secondary | ICD-10-CM | POA: Insufficient documentation

## 2023-10-25 DIAGNOSIS — M2550 Pain in unspecified joint: Secondary | ICD-10-CM | POA: Insufficient documentation

## 2023-10-25 DIAGNOSIS — M7989 Other specified soft tissue disorders: Secondary | ICD-10-CM | POA: Insufficient documentation

## 2023-10-25 DIAGNOSIS — E786 Lipoprotein deficiency: Secondary | ICD-10-CM | POA: Insufficient documentation

## 2023-10-25 DIAGNOSIS — R7309 Other abnormal glucose: Secondary | ICD-10-CM | POA: Insufficient documentation

## 2023-10-25 DIAGNOSIS — M722 Plantar fascial fibromatosis: Secondary | ICD-10-CM | POA: Insufficient documentation

## 2023-10-25 DIAGNOSIS — E7800 Pure hypercholesterolemia, unspecified: Secondary | ICD-10-CM | POA: Insufficient documentation

## 2023-10-25 DIAGNOSIS — I2510 Atherosclerotic heart disease of native coronary artery without angina pectoris: Secondary | ICD-10-CM | POA: Insufficient documentation

## 2023-10-25 DIAGNOSIS — E66811 Obesity, class 1: Secondary | ICD-10-CM | POA: Insufficient documentation

## 2023-10-25 DIAGNOSIS — N200 Calculus of kidney: Secondary | ICD-10-CM | POA: Insufficient documentation

## 2023-10-25 DIAGNOSIS — M549 Dorsalgia, unspecified: Secondary | ICD-10-CM | POA: Insufficient documentation

## 2023-10-25 DIAGNOSIS — R911 Solitary pulmonary nodule: Secondary | ICD-10-CM | POA: Insufficient documentation

## 2023-10-29 ENCOUNTER — Ambulatory Visit: Attending: Cardiology | Admitting: Cardiology

## 2023-10-29 ENCOUNTER — Encounter: Payer: Self-pay | Admitting: Cardiology

## 2023-10-29 VITALS — BP 126/88 | HR 60 | Ht 71.0 in | Wt 237.0 lb

## 2023-10-29 DIAGNOSIS — I2510 Atherosclerotic heart disease of native coronary artery without angina pectoris: Secondary | ICD-10-CM

## 2023-10-29 DIAGNOSIS — I480 Paroxysmal atrial fibrillation: Secondary | ICD-10-CM

## 2023-10-29 DIAGNOSIS — I10 Essential (primary) hypertension: Secondary | ICD-10-CM | POA: Diagnosis not present

## 2023-10-29 DIAGNOSIS — R7301 Impaired fasting glucose: Secondary | ICD-10-CM | POA: Diagnosis not present

## 2023-10-29 NOTE — Patient Instructions (Signed)

## 2023-10-29 NOTE — Progress Notes (Signed)
 Cardiology Office Note:    Date:  10/29/2023   ID:  VONTRELL Smith, DOB Oct 30, 1965, MRN [id number]  PCP:  Tisovec, Richard W, MD  Cardiologist:  Lamar Fitch, MD    Referring MD: Tisovec, Richard W, MD   No chief complaint on file.   History of Present Illness:    Randall TRETO is a 58 y.o. male past medical history significant for essential hypertension, paroxysmal atrial fibrillation successfully suppressed with flecainide , CHA2DS2-VASc only 1, not anticoagulated, coronary CT angio showed only minimal disease does have dyslipidemia.  Comes today 2 months for follow-up overall doing great asymptomatic no chest pain tightness squeezing pressure burning chest, still works hard.  Past Medical History:  Diagnosis Date   A-fib [facility])    Atherosclerosis of coronary artery without angina pectoris 10/25/2023   Back pain    BMI 35.0-35.9,adult 04/22/2023   Chest pain    Chest pain    Class 1 obesity 10/25/2023   Coronary disease mild based on coronary CT angio 05/27/2023   Dizzy spells    Dyslipidemia 05/27/2023   Elevated glucose    Encounter for general adult medical examination without abnormal findings 04/11/2009   GERD (gastroesophageal reflux disease)    GERD (gastroesophageal reflux disease)    High blood pressure    History of severe acute respiratory syndrome coronavirus 2 (SARS-CoV-2) disease 02/11/2020   History of urinary stone 04/07/2012   Hyperglycemia 04/22/2023   Impaired fasting glucose 05/02/2018   Joint pain    Kidney stone    Long term current use of anticoagulant therapy 05/02/2018   Low HDL (under 40)    Lung nodule    Obesity, Beginning BMI 35.6 04/22/2023   Osteoarthritis 04/11/2009   Overweight    Palpitations    Palpitations    Paroxysmal atrial fibrillation (HCC) 06/20/2015   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 Swallow whole.   cyanocobalamin  (VITAMIN B12) 500 MCG tablet Take 2 tablets (1,000 mcg total) by mouth daily. In form of MVI or B   flecainide  (TAMBOCOR ) 100 MG tablet Take 1 tablet (100 mg total) by mouth daily.   losartan  (COZAAR ) 100 MG tablet Take 1 tablet (100 mg total) by mouth daily.   metoprolol  succinate (TOPROL -XL) 25 MG 24 hr tablet Take 1 tablet (25 mg total) by mouth daily.   rosuvastatin  (CRESTOR ) 10 MG tablet Take 1 tablet (10 mg total) by mouth daily.   Vitamin D , Ergocalciferol , (DRISDOL ) 1.25 MG (50000 UNIT) CAPS capsule 1 po q 14 days     Allergies:   Patient has no known allergies.   Social History   Socioeconomic History   Marital status: Married    Spouse name: Not on file   Number of children: Not on file   Years of education: Not on file   Highest education level: Not on file  Occupational History   Not on file  Tobacco Use   Smoking status: Never   Smokeless tobacco: Never  Substance and Sexual Activity   Alcohol use: No   Drug use: No  Sexual activity: Not on file  Other Topics Concern   Not on file  Social History Narrative   Not on file   Social Drivers of Health   Financial Resource Strain: Not on file  Food Insecurity: Not on file  Transportation Needs: Not on file  Physical Activity: Not on file  Stress: Not on file  Social Connections: Not on file     Family History: The patient's family history includes CAD in his maternal aunt; CVA in his mother; Cancer in his father; Heart attack in his father; Heart attack (age of onset: 37) in his maternal grandfather; Heart disease in his father, maternal grandmother, mother, and paternal grandmother; Hyperlipidemia in his mother; Hypertension in his  mother; Kidney disease in his mother; Stroke in his mother. ROS:   Please see the history of present illness.    All 14 point review of systems negative except as described per history of present illness  EKGs/Labs/Other Studies Reviewed:    EKG Interpretation Date/Time:  Tuesday October 29 2023 09:42:43 EDT Ventricular Rate:  60 PR Interval:  156 QRS Duration:  84 QT Interval:  436 QTC Calculation: 436 R Axis:   67  Text Interpretation: Normal sinus rhythm Normal ECG When compared with ECG of 01-Aug-2023 09:24, No significant change was found Confirmed by Bernie Charleston [phone]) on 10/29/2023 9:47:07 AM    Recent Labs: 05/23/2023: Hemoglobin 15.4; Platelets 214; TSH 1.590 07/17/2023: ALT 24; BUN 18; Creatinine, Ser 1.08; Potassium 4.4; Sodium 143  Recent Lipid Panel    Component Value Date/Time   CHOL 98 (L) 07/17/2023 0810   TRIG 104 07/17/2023 0810   HDL 32 (L) 07/17/2023 0810   CHOLHDL 3.1 07/17/2023 0810   LDLCALC 46 07/17/2023 0810    Physical Exam:    VS:  BP 126/88   Pulse 60   Ht 5' 11 (1.803 m)   Wt 237 lb (107.5 kg)   SpO2 98%   BMI 33.05 kg/m     Wt Readings from Last 3 Encounters:  10/29/23 237 lb (107.5 kg)  10/02/23 231 lb (104.8 kg)  09/02/23 234 lb (106.1 kg)     GEN:  Well nourished, well developed in no acute distress HEENT: Normal NECK: No JVD; No carotid bruits LYMPHATICS: No lymphadenopathy CARDIAC: RRR, no murmurs, no rubs, no gallops RESPIRATORY:  Clear to auscultation without rales, wheezing or rhonchi  ABDOMEN: Soft, non-tender, non-distended MUSCULOSKELETAL:  No edema; No deformity  SKIN: Warm and dry LOWER EXTREMITIES: no swelling NEUROLOGIC:  Alert and oriented x 3 PSYCHIATRIC:  Normal affect   ASSESSMENT:    1. Essential hypertension   2. Paroxysmal atrial fibrillation (HCC)   3. Coronary artery disease involving native coronary artery of native heart without angina pectoris   4. Primary hypertension   5. Impaired fasting  glucose    PLAN:    In order of problems listed above:  Essential hypertension blood pressure well-controlled continue present management. Paroxysmal atrial fibrillation rhythm control strategy, denies having any palpitations continue present management. Dyslipidemia I did review K PN LDL 56 HDL 34 this is from 08/16/2023 excellent control continue present management. Prediabetic hemoglobin A1c 5.9 which is 09/02/2023, we did talk about this encouraged him to be more active lose some weight and good diet with avoidance of carbohydrates. Overall he is doing well see him back in 6 months   Medication Adjustments/Labs and Tests Ordered: Current medicines are reviewed at length with the patient today.  Concerns regarding medicines are outlined above.  Orders Placed This Encounter  Procedures   EKG 12-Lead   Medication changes: No orders of the defined types were placed in this encounter.   Signed, Lamar DOROTHA Fitch, MD, [facility] 10/29/2023 10:02 AM    [facility]

## 2023-10-30 ENCOUNTER — Encounter (INDEPENDENT_AMBULATORY_CARE_PROVIDER_SITE_OTHER): Payer: Self-pay | Admitting: Family Medicine

## 2023-10-30 ENCOUNTER — Ambulatory Visit (INDEPENDENT_AMBULATORY_CARE_PROVIDER_SITE_OTHER): Admitting: Family Medicine

## 2023-10-30 VITALS — BP 117/76 | HR 63 | Temp 97.8°F | Ht 71.0 in | Wt 233.0 lb

## 2023-10-30 DIAGNOSIS — E669 Obesity, unspecified: Secondary | ICD-10-CM

## 2023-10-30 DIAGNOSIS — E785 Hyperlipidemia, unspecified: Secondary | ICD-10-CM

## 2023-10-30 DIAGNOSIS — E559 Vitamin D deficiency, unspecified: Secondary | ICD-10-CM

## 2023-10-30 DIAGNOSIS — R7303 Prediabetes: Secondary | ICD-10-CM | POA: Diagnosis not present

## 2023-10-30 DIAGNOSIS — I10 Essential (primary) hypertension: Secondary | ICD-10-CM | POA: Diagnosis not present

## 2023-10-30 DIAGNOSIS — Z6835 Body mass index (BMI) 35.0-35.9, adult: Secondary | ICD-10-CM

## 2023-10-30 DIAGNOSIS — I482 Chronic atrial fibrillation, unspecified: Secondary | ICD-10-CM

## 2023-10-30 DIAGNOSIS — Z6832 Body mass index (BMI) 32.0-32.9, adult: Secondary | ICD-10-CM

## 2023-10-30 DIAGNOSIS — E538 Deficiency of other specified B group vitamins: Secondary | ICD-10-CM

## 2023-10-30 MED ORDER — CYANOCOBALAMIN 500 MCG PO TABS: 1000.0000 ug | ORAL_TABLET | Freq: Every day | ORAL | Status: AC

## 2023-10-30 MED ORDER — VITAMIN D (ERGOCALCIFEROL) 1.25 MG (50000 UNIT) PO CAPS
ORAL_CAPSULE | ORAL | Status: DC
Start: 2023-10-30 — End: 2023-12-25

## 2023-10-30 NOTE — Progress Notes (Signed)
 Randall Smith, D.O.  ABFM, ABOM Specializing in Clinical Bariatric Medicine  Office located at: [address] [place], [place]  [address]     Assessment and Plan:   Medications Discontinued During This Encounter  Medication Reason   Vitamin D , Ergocalciferol , (DRISDOL ) 1.25 MG (50000 UNIT) CAPS capsule    cyanocobalamin  (VITAMIN B12) 500 MCG tablet Reorder    Meds ordered this encounter  Medications   Vitamin D , Ergocalciferol , (DRISDOL ) 1.25 MG (50000 UNIT) CAPS capsule    Sig: 1 po q 16 days   cyanocobalamin  (VITAMIN B12) 500 MCG tablet    Sig: Take 2 tablets (1,000 mcg total) by mouth daily. In form of MVI or B      FOR THE DISEASE OF OBESITY:  BMI 35.0-35.9,adult - current BMI 32.51 Obesity, Beginning BMI 33.9 Assessment & Plan: Since last office visit on 10/02/23 patient's muscle mass has increased by 1 lb. Fat mass has increased by 1.2 lbs. Total body water has increased by 2.2 lbs.  Body fat percent has increased by 0.2 %. Since starting the program in 05/2023, his body fat percent has decreased by 4.1%. Counseling done on how various foods will affect these numbers and how to maximize success.    Total lbs lost to date: 20 lbs Total weight loss percentage to date: -7.91 %   Recommended Dietary Goals Randall Smith is currently in the action stage of change. As such, his goal is to continue weight management plan.  He has agreed to: continue current plan    Behavioral Intervention We discussed the following today: increasing lean protein intake to established goals, decreasing simple carbohydrates , and avoiding skipping meals, reviewed the role of protein as it relates to weight loss. Protein will help increase his metabolism and promote weight loss. Eat his all must eats that are on plan   Additional resources provided today: None  Evidence-based interventions for health behavior change were utilized today including the discussion of self monitoring techniques,  problem-solving barriers and SMART goal setting techniques.   Regarding patient's less desirable eating habits and patterns, we employed the technique of small changes.   Pt will specifically work on: eat all food on plan before snacking and avoid skipping meals   Recommended Physical Activity Goals Randall Smith has been advised to work up to 300-450 minutes of moderate intensity aerobic activity a week and strengthening exercises 2-3 times per week for cardiovascular health, weight loss maintenance and preservation of muscle mass.   He has agreed to: Increase physical activity in their day and reduce sedentary time (increase NEAT). Start exercising; start off small and gradually increase   Pharmacotherapy We both agreed to: Continue with current nutritional and behavioral strategies   ASSOCIATED CONDITIONS ADDRESSED TODAY:  Essential hypertension Chronic atrial fibrillation [facility]) Assessment & Plan: BP Readings from Last 3 Encounters:  10/30/23 117/76  10/29/23 126/88  10/02/23 125/79   BP is at goal today. Followed up with Dr. Bernie of cardiology yesterday and was told to f/u in 6 months. A-fib is well controlled. Currently on Losartan  100 mg once daily for HTN and Flecainide  100 mg once dailyToprol-XL 25 mg once daily for A-fib. Good compliance/tolerance reported. No adverse SE. Reviewed most recent cardiology note.   May potentially decrease dose of his antihypertensive if his BP improves as he loses weight. He will need to continue medication for control of A-fib. Continue antihypertensive regimen as prescribed. Continue working on heart, healthy diet per meal plan; increase protein intake,  and decrease simple carbs, and reduce salt intake. Encouraged patient to increase physical activity daily and gradually increase exercise to recommended 300-450 minutes per day.    Prediabetes Assessment & Plan: Lab Results  Component Value Date   HGBA1C 5.9 (H) 09/02/2023   HGBA1C 6.0 (H)  05/23/2023   INSULIN  11.0 05/23/2023    Reviewed last obtained labs: A1c has improved to 5.9 as of 6/32/25. Not currently on pharmacologic treatment.   At goal A1c of 5.6 reviewed with pt. Continue working on healthy diet per his nutritional meal plan; increasing protein, whole food, and fiber intake. Properly hydrate by drinking 1/2 his body weight and ounces of water per day. Reviewed the pathophysiology of prediabetes and the potential progression to diabetes if poorly controlled. Discuss the option of starting Metformin in the future if clinically necessary.    Vitamin D  deficiency Assessment & Plan: Lab Results  Component Value Date   VD25OH 82.5 09/02/2023   VD25OH 36.3 05/23/2023   Reviewed last obtained labs: Vit D above goal at 82.5. Taking ERGO every 14 days. Good compliance/tolerance. No SE.   Ideal vit D of 50-70 reviewed with pt. Vit D expected to improve as he continues to lose body fat. Discussed how Vit D tends to improve during the summer months and decrease during the winter due to decreased sun exposure. Mutually agreed to DECREASE supplementation to every 16 days. Stressed the importance of adherence. Will recheck in 3-4 months from last obtained labs     B12 insufficiency Assessment & Plan: Taking B12 1,000 mcg. Good compliance/tolerance. No adverse SE. Has noticed improvement with energy levels.   Ideal B12 goal of 500+ reviewed with pt. Continue current supplementation. Will recheck levels periodically; 3-4 months from last obtained.    Dyslipidemia Assessment & Plan: Lab Results  Component Value Date   CHOL 98 (L) 07/17/2023   HDL 32 (L) 07/17/2023   LDLCALC 46 07/17/2023   TRIG 104 07/17/2023   CHOLHDL 3.1 07/17/2023   Currently on Rosuvastatin  10 mg once daily. Good compliance/tolerance. Dr. Bernie of cardiology started him on Rosuvastatin  10 mg in 3/025. Since starting statin therapy, his cholesterol components have improved, most notably his LDL  from 151 on 05/2023 to 46 on 07/16/2023.   Continue with statin therapy as prescribed. Continue working on heart-healthy diet per his nutritional meal plan; decrease saturated and trans fats, decrease simple carbs, and increase protein intake. Reviewed the benefits of exercise with pt. Continue following up with cardiology and PCP as instructed by them. Will continue monitoring.    Follow up:   Return in about 8 weeks (around 12/25/2023) for with Dr Midge. He was informed of the importance of frequent follow up visits to maximize his success with intensive lifestyle modifications for his multiple health conditions.  Subjective:   Chief complaint: Obesity Randall Smith is here to discuss his progress with his obesity treatment plan. He is on the Category 3 Plan with B/L options and only 100 snack calories and states he is following his eating plan approximately 90% of the time. He states he is not exercising.    Interval History:  Randall Smith is here for a follow up office visit. Since last OV on 10/02/23, he is up 2 lbs. Overall, he has continued to follow his meal plan when eating meals, but does admit to skipped meals due to his busy work and family schedule. He is not journaling/tracking his macros. Has not been prioritizing his protein intake and whole  foods. He has struggled with his busy schedule, stating his daughter is soon starting school and plans to play sports. His work day typically ends around 4-5 pm, but he gets home around 10 pm. He does finds it difficult to meal prep sandwiches after getting home that late. He is still sleeping 7-9 hours per night. Tends to snack on fruits.    Pharmacotherapy that aid with weight loss: He is currently taking no anti-obesity medication.    Review of Systems:  Pertinent positives were addressed with patient today.  Reviewed by clinician on day of visit: allergies, medications, problem list, medical history, surgical history, family history, social  history, and previous encounter notes.  Weight Summary and Biometrics   Weight Lost Since Last Visit: 0  Weight Gained Since Last Visit: 2lb    Vitals Temp: 97.8 F (36.6 C) BP: 117/76 Pulse Rate: 63 SpO2: 98 %   Anthropometric Measurements Height: 5' 11 (1.803 m) Weight: 233 lb (105.7 kg) BMI (Calculated): 32.51 Weight at Last Visit: 231lb Weight Lost Since Last Visit: 0 Weight Gained Since Last Visit: 2lb Starting Weight: 253lb Total Weight Loss (lbs): 20 lb (9.072 kg) Peak Weight: 266lb   Body Composition  Body Fat %: 31.1 % Fat Mass (lbs): 72.6 lbs Muscle Mass (lbs): 152.8 lbs Total Body Water (lbs): 109 lbs Visceral Fat Rating : 17   Other Clinical Data Fasting: no Labs: no Today's Visit #: 8 Starting Date: 05/23/23    Objective:   PHYSICAL EXAM: Blood pressure 117/76, pulse 63, temperature 97.8 F (36.6 C), height 5' 11 (1.803 m), weight 233 lb (105.7 kg), SpO2 98%. Body mass index is 32.5 kg/m.  General: he is overweight, cooperative and in no acute distress. PSYCH: Has normal mood, affect and thought process.   HEENT: EOMI, sclerae are anicteric. Lungs: Normal breathing effort, no conversational dyspnea. Extremities: Moves * 4 Neurologic: A and O * 3, good insight  DIAGNOSTIC DATA REVIEWED: BMET    Component Value Date/Time   NA 143 07/17/2023 0810   K 4.4 07/17/2023 0810   CL 105 07/17/2023 0810   CO2 22 07/17/2023 0810   GLUCOSE 103 (H) 07/17/2023 0810   GLUCOSE 137 (H) 12/08/2011 0910   BUN 18 07/17/2023 0810   CREATININE 1.08 07/17/2023 0810   CALCIUM  9.2 07/17/2023 0810   GFRNONAA 68 (L) 12/08/2011 0910   GFRAA 79 (L) 12/08/2011 0910   Lab Results  Component Value Date   HGBA1C 5.9 (H) 09/02/2023   HGBA1C 6.0 (H) 05/23/2023   Lab Results  Component Value Date   INSULIN  11.0 05/23/2023   Lab Results  Component Value Date   TSH 1.590 05/23/2023   CBC    Component Value Date/Time   WBC 6.5 05/23/2023 1123   WBC  11.6 (H) 12/08/2011 0910   RBC 4.93 05/23/2023 1123   RBC 5.01 12/08/2011 0910   HGB 15.4 05/23/2023 1123   HCT 45.8 05/23/2023 1123   PLT 214 05/23/2023 1123   MCV 93 05/23/2023 1123   MCH 31.2 05/23/2023 1123   MCH 31.7 12/08/2011 0910   MCHC 33.6 05/23/2023 1123   MCHC 36.1 (H) 12/08/2011 0910   RDW 12.7 05/23/2023 1123   Iron Studies No results found for: IRON, TIBC, FERRITIN, IRONPCTSAT Lipid Panel     Component Value Date/Time   CHOL 98 (L) 07/17/2023 0810   TRIG 104 07/17/2023 0810   HDL 32 (L) 07/17/2023 0810   CHOLHDL 3.1 07/17/2023 0810   LDLCALC 46 07/17/2023 0810  Hepatic Function Panel     Component Value Date/Time   PROT 6.9 07/17/2023 0810   ALBUMIN 4.3 07/17/2023 0810   AST 20 07/17/2023 0810   ALT 24 07/17/2023 0810   ALKPHOS 61 07/17/2023 0810   BILITOT 0.5 07/17/2023 0810      Component Value Date/Time   TSH 1.590 05/23/2023 1123   Nutritional Lab Results  Component Value Date   VD25OH 82.5 09/02/2023   VD25OH 36.3 05/23/2023    Attestations:   I, Vernell Forest, acting as a medical scribe for Randall Jenkins, DO., have compiled all relevant documentation for today's office visit on behalf of Randall Jenkins, DO, while in the presence of [employer], DO.  Reviewed by clinician on day of visit: allergies, medications, problem list, medical history, surgical history, family history, social history, and previous encounter notes pertinent to patient's obesity diagnosis.  I have spent 48 minutes in the care of the patient today including 38 minutes face-to-face assessing and reviewing listed medical problems above as outlined in office visit note and providing nutritional and behavioral counseling as outlined in obesity care plan.   I have reviewed the above documentation for accuracy and completeness, and I agree with the above. Randall Randall Smith, D.O.  The 21st Century Cures Act was signed into law in 2016 which includes the topic of  electronic health records.  This provides immediate access to information in MyChart.  This includes consultation notes, operative notes, office notes, lab results and pathology reports.  If you have any questions about what you read please let us  know at your next visit so we can discuss your concerns and take corrective action if need be.  We are right here with you.

## 2023-12-25 ENCOUNTER — Ambulatory Visit (INDEPENDENT_AMBULATORY_CARE_PROVIDER_SITE_OTHER): Admitting: Family Medicine

## 2023-12-25 ENCOUNTER — Encounter (INDEPENDENT_AMBULATORY_CARE_PROVIDER_SITE_OTHER): Payer: Self-pay | Admitting: Family Medicine

## 2023-12-25 VITALS — BP 125/76 | HR 59 | Temp 97.9°F | Ht 71.0 in | Wt 233.0 lb

## 2023-12-25 DIAGNOSIS — R7303 Prediabetes: Secondary | ICD-10-CM | POA: Diagnosis not present

## 2023-12-25 DIAGNOSIS — E559 Vitamin D deficiency, unspecified: Secondary | ICD-10-CM | POA: Diagnosis not present

## 2023-12-25 DIAGNOSIS — I482 Chronic atrial fibrillation, unspecified: Secondary | ICD-10-CM | POA: Diagnosis not present

## 2023-12-25 DIAGNOSIS — E669 Obesity, unspecified: Secondary | ICD-10-CM

## 2023-12-25 DIAGNOSIS — Z6832 Body mass index (BMI) 32.0-32.9, adult: Secondary | ICD-10-CM

## 2023-12-25 DIAGNOSIS — I10 Essential (primary) hypertension: Secondary | ICD-10-CM

## 2023-12-25 DIAGNOSIS — Z6835 Body mass index (BMI) 35.0-35.9, adult: Secondary | ICD-10-CM

## 2023-12-25 MED ORDER — VITAMIN D (ERGOCALCIFEROL) 1.25 MG (50000 UNIT) PO CAPS
ORAL_CAPSULE | ORAL | 1 refills | Status: AC
Start: 2023-12-25 — End: ?

## 2023-12-25 NOTE — Progress Notes (Signed)
 Randall Smith, D.O.  ABFM, ABOM Specializing in Clinical Bariatric Medicine  Office located at: [address] [place], [place]  [address]    FOR THE CHRONIC DISEASE OF OBESITY:   Obesity, Beginning BMI 33.9; BMI 35.0-35.9,adult - current BMI 32.51  Weight Summary and Body Composition Analysis  Weight Lost Since Last Visit: 0lb  Weight Gained Since Last Visit: 0lb    Vitals Temp: 97.9 F (36.6 C) BP: 125/76 Pulse Rate: (!) 59 SpO2: 98 %   Anthropometric Measurements Height: 5' 11 (1.803 m) Weight: 233 lb (105.7 kg) BMI (Calculated): 32.51 Weight at Last Visit: 233lb Weight Lost Since Last Visit: 0lb Weight Gained Since Last Visit: 0lb Starting Weight: 253lb Total Weight Loss (lbs): 20 lb (9.072 kg) Peak Weight: 266lb   Body Composition  Body Fat %: 31.5 % Fat Mass (lbs): 73.6 lbs Muscle Mass (lbs): 152 lbs Total Body Water (lbs): 110.8 lbs Visceral Fat Rating : 17   Other Clinical Data Fasting: no Labs: no Today's Visit #: 9 Starting Date: 05/23/23    Chief complaint: Obesity  Interval History Randall Smith is here for a follow-up office visit to discuss his progress with his obesity treatment plan. He is on CAT 3 MP with B/L options and only 100 snack calories and states he is following his eating plan approximately 85 % of the time. He is walking 60-90  minutes 7 days per week  He has experienced no weight change since last OV on 10/30/2023.   His dietary and life habits include:  - Tracking Calories/Macros: not on a tracking plan  - Eating More Whole Foods: yes, but he does mention often eating out on the weekends with his family.  - Adequate Protein Intake: yes, but he acknowledges not always measuring his protein intake   - Adequate Water Intake: yes  - Skipping Meals: no  - Sleeping 7-9 Hours/ Night: yes    10/30/23 08:00 12/25/23 08:00   Body Fat % 31.1 % 31.5 %  Muscle Mass (lbs) 152.8 lbs 152 lbs  Fat Mass (lbs) 72.6  lbs 73.6 lbs  Total Body Water (lbs) 109 lbs 110.8 lbs  Visceral Fat Rating  17 17   Counseling done on how various foods will affect these numbers and how to maximize success  Total lbs lost to date: -20 lbs Total Fat Mass lost to date: -16.4 lbs Total weight loss percentage to date: -7.91 %   Nutritional and Behavioral Counseling:  We discussed the following today: importance of calculating snack calories, increasing lean protein intake to established goals, work on meal planning and preparation, decreasing eating out or consumption of processed foods, and making healthy choices when eating convenient foods, and continue to work on implementation of reduced calorie nutritional plan  Additional resources provided today: Handout on Examples of Low Glycemic Index and Low Calorie Fruits & Vegetables, Handout on CAT 3 meal plan, and Handout on risks/ benefits of metformin and associated Myths of use  Evidence-based interventions for health behavior change were utilized today including the discussion of self monitoring techniques, problem-solving barriers and SMART goal setting techniques.   Regarding patient's less desirable eating habits and patterns, we employed the technique of small changes.   SMART Goal(s) created today: weigh protein intake + measure snack calories to prevent eating too much fruit   Recommended Dietary Goals Randall Smith is currently in the action stage of change. As such, his goal is to continue weight management plan.  He has  agreed to continue the CAT 3 MP with only 100 snack cal.   Recommended Physical Activity Goals Randall Smith has been advised to work up to 300-450 minutes of moderate intensity aerobic activity a week and strengthening exercises 2-3 times per week for cardiovascular health, weight loss maintenance and preservation of muscle mass.   He was encouraged to continue to gradually increase the amount and intensity of exercise routine   Medical Interventions and  Pharmacotherapy Previous Bariatric surgery: n/a Pharmacotherapy: See Pre-DM note.    OBESITY RELATED CONDITIONS ADDRESSED TODAY:   Medications Discontinued During This Encounter  Medication Reason   Vitamin D , Ergocalciferol , (DRISDOL ) 1.25 MG (50000 UNIT) CAPS capsule Reorder     Meds ordered this encounter  Medications   Vitamin D , Ergocalciferol , (DRISDOL ) 1.25 MG (50000 UNIT) CAPS capsule    Sig: 1 po q 16 days    Dispense:  5 capsule    Refill:  1     Plan for non-fasting labs next OV   Prediabetes Assessment & Plan: Lab Results  Component Value Date   HGBA1C 5.9 (H) 09/02/2023   HGBA1C 6.0 (H) 05/23/2023   INSULIN  11.0 05/23/2023    Pre-DM managed with dietary and life style interventions. No c/o excessive hunger and cravings. Was educated on Metformin today, it's role in assisting with wt loss, I.R, and Pre-DM, and commonly associated side effects. Provided handout on risks/benefits of Metformin and associated myths of use. Pt will inform us  whether he desires to start Metformin in the near future. Cont working on [employer] per his nutritional meal plan; increasing protein, whole food, and fiber intake. Properly hydrate by drinking 1/2 his body weight and ounces of water per day. Recheck labs next OV.    Chronic atrial fibrillation (HCC)/Essential hypertension Assessment & Plan: Last 3 blood pressure readings in our office are as follows: BP Readings from Last 3 Encounters:  12/25/23 125/76  10/30/23 117/76  10/29/23 126/88   The ASCVD Risk score (Arnett DK, et al., 2019) failed to calculate for the following reasons:   The valid total cholesterol range is 130 to 320 mg/dL  Lab Results  Component Value Date   CREATININE 1.08 07/17/2023   Currently on Flecainide  100 mg daily and Toprol -XL 25 mg daily for A-fib and Losartan  100 mg daily for HTN. A-Fib well controlled. BP at goal today. No acute concerns. Cont regimen per cardiology and f/up as directed.Cont  working on heart, healthy diet per meal plan; increase protein intake, and decrease simple carbs, and reduce salt intake. Increase exercise as able.    Vitamin D  deficiency Assessment & Plan: Lab Results  Component Value Date   VD25OH 82.5 09/02/2023   VD25OH 36.3 05/23/2023   Doing well on Ergocalciferol  50,000 units q 16 days. No acute concerns. Continue same regimen (refill today).  Recheck as deemed medically necessary.    Objective:   PHYSICAL EXAM: Blood pressure 125/76, pulse (!) 59, temperature 97.9 F (36.6 C), height 5' 11 (1.803 m), weight 233 lb (105.7 kg), SpO2 98%. Body mass index is 32.5 kg/m.  General: he is overweight, cooperative and in no acute distress. PSYCH: Has normal mood, affect and thought process.   HEENT: EOMI, sclerae are anicteric. Lungs: Normal breathing effort, no conversational dyspnea. Extremities: Moves * 4 Neurologic: A and O * 3, good insight  DIAGNOSTIC DATA REVIEWED: BMET    Component Value Date/Time   NA 143 07/17/2023 0810   K 4.4 07/17/2023 0810   CL 105 07/17/2023 0810  CO2 22 07/17/2023 0810   GLUCOSE 103 (H) 07/17/2023 0810   GLUCOSE 137 (H) 12/08/2011 0910   BUN 18 07/17/2023 0810   CREATININE 1.08 07/17/2023 0810   CALCIUM  9.2 07/17/2023 0810   GFRNONAA 68 (L) 12/08/2011 0910   GFRAA 79 (L) 12/08/2011 0910   Lab Results  Component Value Date   HGBA1C 5.9 (H) 09/02/2023   HGBA1C 6.0 (H) 05/23/2023   Lab Results  Component Value Date   INSULIN  11.0 05/23/2023   Lab Results  Component Value Date   TSH 1.590 05/23/2023   CBC    Component Value Date/Time   WBC 6.5 05/23/2023 1123   WBC 11.6 (H) 12/08/2011 0910   RBC 4.93 05/23/2023 1123   RBC 5.01 12/08/2011 0910   HGB 15.4 05/23/2023 1123   HCT 45.8 05/23/2023 1123   PLT 214 05/23/2023 1123   MCV 93 05/23/2023 1123   MCH 31.2 05/23/2023 1123   MCH 31.7 12/08/2011 0910   MCHC 33.6 05/23/2023 1123   MCHC 36.1 (H) 12/08/2011 0910   RDW 12.7 05/23/2023  1123   Iron Studies No results found for: IRON, TIBC, FERRITIN, IRONPCTSAT Lipid Panel     Component Value Date/Time   CHOL 98 (L) 07/17/2023 0810   TRIG 104 07/17/2023 0810   HDL 32 (L) 07/17/2023 0810   CHOLHDL 3.1 07/17/2023 0810   LDLCALC 46 07/17/2023 0810   Hepatic Function Panel     Component Value Date/Time   PROT 6.9 07/17/2023 0810   ALBUMIN 4.3 07/17/2023 0810   AST 20 07/17/2023 0810   ALT 24 07/17/2023 0810   ALKPHOS 61 07/17/2023 0810   BILITOT 0.5 07/17/2023 0810      Component Value Date/Time   TSH 1.590 05/23/2023 1123   Nutritional Lab Results  Component Value Date   VD25OH 82.5 09/02/2023   VD25OH 36.3 05/23/2023     Follow up:   Return 02/26/2024 at 8:20 AM.  He was informed of the importance of frequent follow up visits to maximize his success with intensive lifestyle modifications for his multiple health conditions.   Attestations:   I, Special Puri, acting as a Stage manager for [employer], DO., have compiled all relevant documentation for today's office visit on behalf of Randall Jenkins, DO, while in the presence of [employer], DO.  Pertinent positives were addressed with patient today. Reviewed by clinician on day of visit: allergies, medications, problem list, medical history, surgical history, family history, social history, and previous encounter notes.  I have spent 42 minutes in the care of the patient today including 35 minutes face-to-face assessing and reviewing listed medical problems above as outlined in office visit note and providing nutritional and behavioral counseling as outlined in obesity care plan.   I have reviewed the above documentation for accuracy and completeness, and I agree with the above. Randall JINNY Smith, D.O.  The 21st Century Cures Act was signed into law in 2016 which includes the topic of electronic health records.  This provides immediate access to information in MyChart. This includes  consultation notes, operative notes, office notes, lab results and pathology reports.  If you have any questions about what you read please let us  know at your next visit so we can discuss your concerns and take corrective action if need be.  We are right here with you.

## 2024-02-24 ENCOUNTER — Other Ambulatory Visit: Payer: Self-pay

## 2024-02-24 DIAGNOSIS — I480 Paroxysmal atrial fibrillation: Secondary | ICD-10-CM

## 2024-02-26 ENCOUNTER — Ambulatory Visit (INDEPENDENT_AMBULATORY_CARE_PROVIDER_SITE_OTHER): Payer: Self-pay | Admitting: Family Medicine

## 2024-02-26 MED ORDER — FLECAINIDE ACETATE 100 MG PO TABS: 100.0000 mg | ORAL_TABLET | Freq: Every day | ORAL | 1 refills | Status: AC

## 2024-03-10 ENCOUNTER — Other Ambulatory Visit: Payer: Self-pay

## 2024-03-10 DIAGNOSIS — I480 Paroxysmal atrial fibrillation: Secondary | ICD-10-CM

## 2024-03-11 MED ORDER — METOPROLOL SUCCINATE ER 25 MG PO TB24: 25.0000 mg | ORAL_TABLET | Freq: Every day | ORAL | 2 refills | Status: AC
